# Patient Record
Sex: Female | Born: 1970 | Race: White | Hispanic: No | Marital: Married | Smoking: Never smoker
Health system: Southern US, Community
[De-identification: ages and names within clinical notes are randomized; demographics above are authoritative.]

## PROBLEM LIST (undated history)

## (undated) DIAGNOSIS — E785 Hyperlipidemia, unspecified: Secondary | ICD-10-CM

## (undated) DIAGNOSIS — I341 Nonrheumatic mitral (valve) prolapse: Secondary | ICD-10-CM

## (undated) DIAGNOSIS — G4700 Insomnia, unspecified: Secondary | ICD-10-CM

## (undated) DIAGNOSIS — I10 Essential (primary) hypertension: Secondary | ICD-10-CM

## (undated) HISTORY — DX: Nonrheumatic mitral (valve) prolapse: I34.1

## (undated) HISTORY — PX: BREAST ENHANCEMENT SURGERY: SHX7

## (undated) HISTORY — PX: WISDOM TOOTH EXTRACTION: SHX21

## (undated) HISTORY — DX: Insomnia, unspecified: G47.00

## (undated) HISTORY — DX: Hyperlipidemia, unspecified: E78.5

## (undated) HISTORY — PX: AUGMENTATION MAMMAPLASTY: SUR837

---

## 1997-12-22 ENCOUNTER — Inpatient Hospital Stay (HOSPITAL_COMMUNITY): Admission: AD | Admit: 1997-12-22 | Discharge: 1997-12-22 | Payer: Self-pay | Admitting: Obstetrics and Gynecology

## 1998-01-03 ENCOUNTER — Inpatient Hospital Stay (HOSPITAL_COMMUNITY): Admission: AD | Admit: 1998-01-03 | Discharge: 1998-01-05 | Payer: Self-pay | Admitting: *Deleted

## 1998-01-06 ENCOUNTER — Encounter: Admission: RE | Admit: 1998-01-06 | Discharge: 1998-04-06 | Payer: Self-pay | Admitting: *Deleted

## 2000-04-19 ENCOUNTER — Other Ambulatory Visit: Admission: RE | Admit: 2000-04-19 | Discharge: 2000-04-19 | Payer: Self-pay | Admitting: Obstetrics and Gynecology

## 2001-04-23 ENCOUNTER — Other Ambulatory Visit: Admission: RE | Admit: 2001-04-23 | Discharge: 2001-04-23 | Payer: Self-pay | Admitting: Obstetrics and Gynecology

## 2002-06-02 ENCOUNTER — Other Ambulatory Visit: Admission: RE | Admit: 2002-06-02 | Discharge: 2002-06-02 | Payer: Self-pay | Admitting: Obstetrics and Gynecology

## 2002-06-15 ENCOUNTER — Encounter: Payer: Self-pay | Admitting: Family Medicine

## 2002-06-15 ENCOUNTER — Ambulatory Visit (HOSPITAL_COMMUNITY): Admission: RE | Admit: 2002-06-15 | Discharge: 2002-06-15 | Payer: Self-pay | Admitting: Family Medicine

## 2003-06-04 ENCOUNTER — Other Ambulatory Visit: Admission: RE | Admit: 2003-06-04 | Discharge: 2003-06-04 | Payer: Self-pay | Admitting: Obstetrics and Gynecology

## 2003-11-27 ENCOUNTER — Encounter (INDEPENDENT_AMBULATORY_CARE_PROVIDER_SITE_OTHER): Payer: Self-pay | Admitting: *Deleted

## 2003-11-27 ENCOUNTER — Ambulatory Visit (HOSPITAL_COMMUNITY): Admission: RE | Admit: 2003-11-27 | Discharge: 2003-11-27 | Payer: Self-pay | Admitting: Obstetrics and Gynecology

## 2009-09-24 ENCOUNTER — Emergency Department (HOSPITAL_COMMUNITY): Admission: EM | Admit: 2009-09-24 | Discharge: 2009-09-24 | Payer: Self-pay | Admitting: Emergency Medicine

## 2010-09-11 ENCOUNTER — Ambulatory Visit (HOSPITAL_COMMUNITY): Admission: RE | Admit: 2010-09-11 | Discharge: 2010-09-11 | Payer: Self-pay | Admitting: Obstetrics and Gynecology

## 2011-03-23 NOTE — Op Note (Signed)
NAME:  Misty Hayes, Misty Hayes                          ACCOUNT NO.:  [id number]   MEDICAL RECORD NO.:  [id number]                   PATIENT TYPE:  AMB   LOCATION:  SDC                                  FACILITY:  [facility]   PHYSICIAN:  Maxie Better, M.D.            DATE OF BIRTH:  1971-01-17   DATE OF PROCEDURE:  11/27/2003  DATE OF DISCHARGE:                                 OPERATIVE REPORT   PREOPERATIVE DIAGNOSIS:  Blighted ovum.   PROCEDURE:  Suction dilation and evacuation.   POSTOPERATIVE DIAGNOSIS:  Blighted ovum.   ANESTHESIA:  MAC, paracervical block.   SURGEON:  Maxie Better, M.D.   INDICATIONS:  This is a 40 year old gravida 3, para 1-1-0-2, female with a  diagnosis of blighted ovum, who presents for surgical management.  The  patient initially tried conservative measures; however, she still has tissue  within the cavity.  Risks and benefits of the procedure have been explained  to the patient, consent was signed, and the patient was transferred to the  operating room.   DESCRIPTION OF PROCEDURE:  Under adequate monitored anesthesia, the patient  was placed in the dorsal lithotomy position.  She was sterilely prepped and  draped in the usual fashion and the bladder was catheterized of a small  amount of urine.  Bimanual examination revealed an anteverted uterus, about  eight weeks' size.  No adnexal masses could be appreciated.  Bivalve  speculum was placed in the vagina, 20 mL of 1% Nesacaine was injected  paracervically at 3 and 9 o'clock.  The cervix was noted to be parous and  friable.  The anterior lip of the cervix was grasped with a tenaculum.  The  cervix was serially dilated up to a #27 Pratt dilator.  A #8 mm curved  suction cannula was introduced into the uterine cavity, and each time a gush  of blood came through the cannula.  The cannula was attached to the suction  tube and a moderate amount of products of conception was removed.  The  cavity was then  curetted, suctioned, and when all tissue was felt to have  been removed, all instruments were then removed from the vagina.  A specimen  labeled products of conception was sent to pathology.  Estimated blood  loss was minimal.  Complications:  None.  The patient tolerated the  procedure well, was transferred to the recovery room in stable condition.                                               Maxie Better, M.D.    [place]/MEDQ  D:  11/27/2003  T:  11/27/2003  Job:  [id number]

## 2011-03-23 NOTE — H&P (Signed)
NAME:  Misty Hayes, Misty Hayes                          ACCOUNT NO.:  [id number]   MEDICAL RECORD NO.:  [id number]                   PATIENT TYPE:  AMB   LOCATION:  SDC                                  FACILITY:  [facility]   PHYSICIAN:  Maxie Better, M.D.            DATE OF BIRTH:  [birth year]   DATE OF ADMISSION:  11/27/2003  DATE OF DISCHARGE:                                HISTORY & PHYSICAL   CHIEF COMPLAINT:  Blighted ovum.   HISTORY OF PRESENT ILLNESS:  This is a 40 year old gravida 3, para 1-1-0-2  married white female, who is diagnosed with a missed abortion and is now  being admitted for surgical management.  The patient's last menstrual period  was September 15, 2003.  She underwent an ultrasound for first trimester  bleeding on November 12, 2003, at which time she was found to have  intrauterine pregnancy with no heartbeat.  The patient tried expected  management; however, has continued to have intermittent vaginal bleeding.  Redo of ultrasound on November 23, 2003 after increased vaginal bleeding  still showed the pregnancy within the uterus.  The patient is scheduled for  surgery.  She has had mild cramping.   PAST MEDICAL HISTORY:  1. Mitral valve prolapse with mitral regurgitation and aortic regurgitation.     Antibiotic prophylaxis.  2. Gestational diabetes.   ALLERGIES:  NO KNOWN DRUG ALLERGIES.   MEDICATIONS:  Prenatal vitamins.   PAST SURGICAL HISTORY:  1. Breast implantation.  2. LEEP procedure.  3. Left knee surgery.   OBSTETRICAL HISTORY:  Vaginal deliveries March 1999 and 1998 at 34 weeks.   FAMILY HISTORY:  Noncontributory.   SOCIAL HISTORY:  Married.  Two children.  Nonsmoker.   REVIEW OF SYSTEMS:  As per history of present illness, otherwise negative.   PHYSICAL EXAMINATION:  GENERAL:  Well developed, well nourished white  female, in no acute distress.  VITAL SIGNS:  Blood pressure 188/80, temperature 98.2, weight 161 pounds.  SKIN:  Shows no lesions.  HEENT:  Anicteric sclera.  Pink conjunctiva.  Oropharynx negative.  HEART:  Regular rate and rhythm.  LUNGS:  Clear to auscultation.  BREASTS:  Show no palpable mass.  Bilateral implants.  ABDOMEN:  Soft and nontender.  PELVIC:  Vulva shows no lesions.  Vagina had dark brown blood in the vault.  Cervix was closed.  Parous uterus about five-week size; anteverted and  nontender.  Adnexa nontender, no palpable mass.   IMPRESSION:  Blighted ovum.   PLAN:  Suction dilation evacuation.  Antibiotics prophylaxis for the mitral  valve prolapse history, using ampicillin and gentamicin.  The patient is  known to be blood type A-positive, therefore RhoGAM is not indicated.  The  risks of the procedure have been reviewed with the patient, including but  not limited to:  infection, bleeding, uterine perforation and its  management, retained products of conception and need for re-operation and  possible antibiotics  use at that time as well was discussed; internal scar  tissue, which may cause future fertility issues discussed; injury to  surrounding organs structures, such as the bladder, bowel, uterus _______  were discussed.  All questions were answered.                                               Maxie Better, M.D.    [place]/MED[facility]  D:  11/25/2003  T:  11/25/2003  Job:  [id number]

## 2011-09-19 ENCOUNTER — Other Ambulatory Visit (HOSPITAL_COMMUNITY): Payer: Self-pay | Admitting: Obstetrics and Gynecology

## 2011-09-19 DIAGNOSIS — Z1231 Encounter for screening mammogram for malignant neoplasm of breast: Secondary | ICD-10-CM

## 2011-10-18 ENCOUNTER — Ambulatory Visit (HOSPITAL_COMMUNITY)
Admission: RE | Admit: 2011-10-18 | Discharge: 2011-10-18 | Disposition: A | Discharge status: Home / Self Care | Payer: BC Managed Care – PPO | Source: Ambulatory Visit | Attending: Obstetrics and Gynecology | Admitting: Obstetrics and Gynecology

## 2011-10-18 DIAGNOSIS — Z1231 Encounter for screening mammogram for malignant neoplasm of breast: Secondary | ICD-10-CM | POA: Insufficient documentation

## 2012-09-11 ENCOUNTER — Other Ambulatory Visit (HOSPITAL_COMMUNITY): Payer: Self-pay | Admitting: Obstetrics and Gynecology

## 2012-09-11 DIAGNOSIS — Z1231 Encounter for screening mammogram for malignant neoplasm of breast: Secondary | ICD-10-CM

## 2012-10-20 ENCOUNTER — Ambulatory Visit (HOSPITAL_COMMUNITY)
Admission: RE | Admit: 2012-10-20 | Discharge: 2012-10-20 | Disposition: A | Discharge status: Home / Self Care | Payer: BC Managed Care – PPO | Source: Ambulatory Visit | Attending: Obstetrics and Gynecology | Admitting: Obstetrics and Gynecology

## 2012-10-20 ENCOUNTER — Other Ambulatory Visit (HOSPITAL_COMMUNITY): Payer: Self-pay | Admitting: Obstetrics and Gynecology

## 2012-10-20 DIAGNOSIS — Z1231 Encounter for screening mammogram for malignant neoplasm of breast: Secondary | ICD-10-CM | POA: Insufficient documentation

## 2013-01-30 ENCOUNTER — Ambulatory Visit (INDEPENDENT_AMBULATORY_CARE_PROVIDER_SITE_OTHER): Payer: BC Managed Care – PPO | Admitting: Family Medicine

## 2013-01-30 ENCOUNTER — Encounter: Payer: Self-pay | Admitting: Family Medicine

## 2013-01-30 VITALS — BP 122/78 | HR 80 | Ht 67.0 in | Wt 169.2 lb

## 2013-01-30 DIAGNOSIS — J309 Allergic rhinitis, unspecified: Secondary | ICD-10-CM | POA: Insufficient documentation

## 2013-01-30 DIAGNOSIS — G4700 Insomnia, unspecified: Secondary | ICD-10-CM

## 2013-01-30 DIAGNOSIS — I059 Rheumatic mitral valve disease, unspecified: Secondary | ICD-10-CM

## 2013-01-30 DIAGNOSIS — I341 Nonrheumatic mitral (valve) prolapse: Secondary | ICD-10-CM

## 2013-01-30 NOTE — Patient Instructions (Signed)
Try benadryl 25 mg at night ads needed.

## 2013-01-30 NOTE — Progress Notes (Signed)
  Subjective:    Patient ID: Misty Hayes, female    DOB: 10-Mar-1971, 42 y.o.   MRN: [id number]  Anxiety Presents for initial visit. Onset was 1 to 5 years ago. The problem has been gradually improving. Symptoms include decreased concentration. Patient reports no chest pain, compulsions or feeling of choking. Symptoms occur constantly. The severity of symptoms is mild (worse at work). The symptoms are aggravated by family issues. The quality of sleep is poor. Nighttime awakenings: early a.m. for rest of night (not the best sleep pattern worse. melatonin didn't help much).   There are no known risk factors. Her past medical history is significant for depression. Past treatments include nothing. The treatment provided mild relief. Compliance with prior treatments has been good.    occas jumping of eart. dx'ed long ago. Associated with mitral valve prolapse. Nasal congestion. Chronic. Zyrtec-D twice a day definitely helps.  Review of Systems  Cardiovascular: Negative for chest pain.  Psychiatric/Behavioral: Positive for decreased concentration.   Ros otherwise neg    Objective:   Physical Exam Alert. Vitals reviewed. HEENT slight nasal congestion. Heart regular rate and rhythm. No opening snap auscultated. Lungs clear to auscultation. Neuro exam intact.   Allergic rhinitis  Insomnia  No orders of the defined types were placed in this encounter.       Assessment & Plan:  Impression #1 chronic rhinitis discussed. #2 insomnia ongoing discussed. #3 anxiety clinically stable patient states benefiting from medicines discussed.plan as per orders. Medicine refilled. Diet exercise discussed. Recheck in 6 months. 25 minutes spent most in discussion. WSL

## 2013-02-01 ENCOUNTER — Encounter: Payer: Self-pay | Admitting: Family Medicine

## 2013-02-01 DIAGNOSIS — I341 Nonrheumatic mitral (valve) prolapse: Secondary | ICD-10-CM | POA: Insufficient documentation

## 2013-02-02 ENCOUNTER — Ambulatory Visit: Payer: Self-pay | Admitting: Family Medicine

## 2013-04-21 ENCOUNTER — Encounter: Payer: Self-pay | Admitting: Family Medicine

## 2013-04-21 ENCOUNTER — Ambulatory Visit (INDEPENDENT_AMBULATORY_CARE_PROVIDER_SITE_OTHER): Payer: BC Managed Care – PPO | Admitting: Family Medicine

## 2013-04-21 VITALS — BP 130/82 | Temp 98.3°F | Wt 168.2 lb

## 2013-04-21 DIAGNOSIS — R21 Rash and other nonspecific skin eruption: Secondary | ICD-10-CM

## 2013-04-21 NOTE — Progress Notes (Signed)
  Subjective:    Patient ID: Misty Hayes, female    DOB: [birth year], 42 y.o.   MRN: [id number]  HPI Noticed a spot on the leg. No itching or burning. Bullseye appearance. Itching at times. They thought it looked like a spider bite. Got more inflammed. Just applied something for itching  Review of Systems    no fever no chills no rash elsewhere ROS otherwise negative Objective:   Physical Exam Alert no acute distress. Lungs clear. Heart regular rate and rhythm. HEENT normal. Skin small patch of faint erythema around tiny bite site. No tenderness.       Assessment & Plan:  Impression local bite with secondary skin reaction discussed. Plan symptomatic care only and reassurance.

## 2013-07-16 ENCOUNTER — Other Ambulatory Visit: Payer: Self-pay | Admitting: Family Medicine

## 2013-09-17 ENCOUNTER — Other Ambulatory Visit (HOSPITAL_COMMUNITY): Payer: Self-pay | Admitting: Obstetrics and Gynecology

## 2013-09-17 DIAGNOSIS — Z1231 Encounter for screening mammogram for malignant neoplasm of breast: Secondary | ICD-10-CM

## 2013-09-25 ENCOUNTER — Other Ambulatory Visit: Payer: Self-pay | Admitting: Family Medicine

## 2013-10-22 ENCOUNTER — Ambulatory Visit (HOSPITAL_COMMUNITY): Payer: BC Managed Care – PPO

## 2013-10-22 ENCOUNTER — Ambulatory Visit (HOSPITAL_COMMUNITY)
Admission: RE | Admit: 2013-10-22 | Discharge: 2013-10-22 | Disposition: A | Discharge status: Home / Self Care | Payer: BC Managed Care – PPO | Source: Ambulatory Visit | Attending: Obstetrics and Gynecology | Admitting: Obstetrics and Gynecology

## 2013-10-22 DIAGNOSIS — Z1231 Encounter for screening mammogram for malignant neoplasm of breast: Secondary | ICD-10-CM | POA: Insufficient documentation

## 2013-12-09 ENCOUNTER — Ambulatory Visit (INDEPENDENT_AMBULATORY_CARE_PROVIDER_SITE_OTHER): Payer: BC Managed Care – PPO | Admitting: Family Medicine

## 2013-12-09 ENCOUNTER — Encounter: Payer: Self-pay | Admitting: Family Medicine

## 2013-12-09 VITALS — BP 122/78 | Temp 98.6°F | Ht 67.0 in | Wt 169.0 lb

## 2013-12-09 DIAGNOSIS — J310 Chronic rhinitis: Secondary | ICD-10-CM

## 2013-12-09 DIAGNOSIS — J329 Chronic sinusitis, unspecified: Secondary | ICD-10-CM

## 2013-12-09 MED ORDER — CITALOPRAM HYDROBROMIDE 20 MG PO TABS: ORAL_TABLET | ORAL | Status: DC

## 2013-12-09 MED ORDER — AMOXICILLIN 500 MG PO CAPS: 500.0000 mg | ORAL_CAPSULE | Freq: Three times a day (TID) | ORAL | Status: DC

## 2013-12-09 NOTE — Progress Notes (Signed)
   Subjective:    Patient ID: Misty RooseveltMisty M Hayes, female    DOB: 03/11/1971, 43 y.o.   MRN: [id number]  Sore Throat  This is a new problem. The current episode started in the past 7 days. The pain is worse on the right side. Associated symptoms include ear pain. She has tried NSAIDs for the symptoms. The treatment provided mild relief.   Monday pain in fthe throat, more and more sore  Then right ear pain  Headache off and on  Sinus cong  Non smoker  used three ibu prn pain    Review of Systems  HENT: Positive for ear pain.    no vomiting no diarrhea no chest pain no back pain ROS otherwise negative     Objective:   Physical Exam  Alert no apparent distress. HEENT moderate nasal congestion frontal tenderness pharynx erythematous neck supple. Lungs clear. Heart regular in rhythm.      Assessment & Plan:  Impression 1 rhinosinusitis plan antibiotics prescribed. Symptomatic care discussed. WSL

## 2014-06-29 ENCOUNTER — Encounter: Payer: Self-pay | Admitting: Family Medicine

## 2014-06-29 ENCOUNTER — Ambulatory Visit (INDEPENDENT_AMBULATORY_CARE_PROVIDER_SITE_OTHER): Payer: BC Managed Care – PPO | Admitting: Family Medicine

## 2014-06-29 VITALS — BP 130/80 | Ht 67.0 in | Wt 174.0 lb

## 2014-06-29 DIAGNOSIS — I341 Nonrheumatic mitral (valve) prolapse: Secondary | ICD-10-CM

## 2014-06-29 DIAGNOSIS — I059 Rheumatic mitral valve disease, unspecified: Secondary | ICD-10-CM

## 2014-06-29 DIAGNOSIS — G4700 Insomnia, unspecified: Secondary | ICD-10-CM

## 2014-06-29 MED ORDER — CITALOPRAM HYDROBROMIDE 20 MG PO TABS: ORAL_TABLET | ORAL | Status: DC

## 2014-06-29 NOTE — Progress Notes (Signed)
   Subjective:    Patient ID: Misty Hayes, female    DOB: [birth year], 43 y.o.   MRN: [id number]  HPI Patient is here today for a 6 month med check.  Walking regularly fift min per wk   Sticking with nmed   Pt works in cubicles, has diff time with loud noise  Irritated first thing in thre morn, with stress at work   Patient still has difficulty sleeping though overall much improved. Notes that the Celexa definitely helps her.  Review of Systems No headache no chest pain no back pain no change in bowel habits no blood in stool ROS otherwise negative    Objective:   Physical Exam Alert no acute distress. Vitals stable. Lungs clear. Heart regular in rhythm. H&T normal. Feet without edema.       Assessment & Plan:  Impression 1 insomnia ongoing. With elements of anxiety and depression clinically stable #2 hyperlipidemia no recent blood work due soon patient states form bring by air for [place] to look at. Plan maintain same meds. Diet exercise discussed. WSL

## 2014-12-06 ENCOUNTER — Other Ambulatory Visit (HOSPITAL_COMMUNITY): Payer: Self-pay | Admitting: Obstetrics and Gynecology

## 2014-12-06 DIAGNOSIS — Z1231 Encounter for screening mammogram for malignant neoplasm of breast: Secondary | ICD-10-CM

## 2014-12-10 ENCOUNTER — Ambulatory Visit (HOSPITAL_COMMUNITY)
Admission: RE | Admit: 2014-12-10 | Discharge: 2014-12-10 | Disposition: A | Discharge status: Home / Self Care | Payer: BLUE CROSS/BLUE SHIELD | Source: Ambulatory Visit | Attending: Obstetrics and Gynecology | Admitting: Obstetrics and Gynecology

## 2014-12-10 ENCOUNTER — Other Ambulatory Visit (HOSPITAL_COMMUNITY): Payer: Self-pay | Admitting: Obstetrics and Gynecology

## 2014-12-10 DIAGNOSIS — Z1231 Encounter for screening mammogram for malignant neoplasm of breast: Secondary | ICD-10-CM | POA: Diagnosis present

## 2015-02-03 ENCOUNTER — Other Ambulatory Visit: Payer: Self-pay | Admitting: Family Medicine

## 2015-03-25 ENCOUNTER — Encounter: Payer: Self-pay | Admitting: Family Medicine

## 2015-03-25 ENCOUNTER — Ambulatory Visit (INDEPENDENT_AMBULATORY_CARE_PROVIDER_SITE_OTHER): Payer: BLUE CROSS/BLUE SHIELD | Admitting: Family Medicine

## 2015-03-25 VITALS — BP 130/80 | Ht 67.0 in | Wt 184.1 lb

## 2015-03-25 DIAGNOSIS — G4700 Insomnia, unspecified: Secondary | ICD-10-CM | POA: Diagnosis not present

## 2015-03-25 MED ORDER — CITALOPRAM HYDROBROMIDE 20 MG PO TABS: ORAL_TABLET | ORAL | Status: DC

## 2015-03-25 NOTE — Progress Notes (Signed)
   Subjective:    Patient ID: Misty Hayes, female    DOB: 06/08/1971, 44 y.o.   MRN: [id number]  HPI Patient is here today for a follow up visit on insomnia  . Patient states that she has no concerns at this time. Patient is doing very well.   Pt now walking again, has changed eating habit  Has lost a little weight, eating better,   Ongoing substantial stress with a 45 year old son who is misbehaving.  Reports to generic Celexa still definitely helping.  Review of Systems No headache no cough no chest pain no weight loss no weight gain    Objective:   Physical Exam Alert vitals stable slight malaise. HEENT normal lungs clear heart regular rate and rhythm       Assessment & Plan:  Impression element of depression with insomnia clinically stable plan maintain same medication. Strongly encouraged to work on exercise and diet. Follow-up every 6 months. WSL

## 2015-06-06 ENCOUNTER — Encounter: Payer: Self-pay | Admitting: Family Medicine

## 2015-06-06 ENCOUNTER — Ambulatory Visit (INDEPENDENT_AMBULATORY_CARE_PROVIDER_SITE_OTHER): Payer: BLUE CROSS/BLUE SHIELD | Admitting: Family Medicine

## 2015-06-06 VITALS — BP 122/88 | Ht 67.0 in | Wt 178.0 lb

## 2015-06-06 DIAGNOSIS — G4700 Insomnia, unspecified: Secondary | ICD-10-CM | POA: Diagnosis not present

## 2015-06-06 DIAGNOSIS — E785 Hyperlipidemia, unspecified: Secondary | ICD-10-CM

## 2015-06-06 MED ORDER — CITALOPRAM HYDROBROMIDE 20 MG PO TABS: ORAL_TABLET | ORAL | Status: DC

## 2015-06-06 NOTE — Progress Notes (Signed)
   Subjective:    Patient ID: Misty Hayes, female    DOB: 26-Jul-1971, 44 y.o.   MRN: [id number]  HPIpt arrives today for lab results. Pt states no concerns today.   No results found for this or any previous visit.  She takes celexa.   fam hx of high chol, no heart disease, none in siblings   Not exrcising any more or less, usually five d per wk  Diet overall prety good  mon thru [place] , othe than that water Not having any problems.     Review of Systems No headache no chest pain no back pain no abdominal pain no change in bowel habits    Objective:   Physical Exam Alert vitals stable no acute distress. Lungs clear. Heart regular in rhythm. H&T normal.       Assessment & Plan:  Impression 1 hyperlipidemia discussed not high enough yet for medications #2 depression good control plan diet exercise discussed. No prescription meds for cholesterol. Diet exercise discussed encouraged follow-up in 6 months scan blood work [facility]

## 2015-06-06 NOTE — Patient Instructions (Signed)

## 2015-06-07 DIAGNOSIS — E785 Hyperlipidemia, unspecified: Secondary | ICD-10-CM | POA: Insufficient documentation

## 2015-12-07 ENCOUNTER — Ambulatory Visit: Payer: BLUE CROSS/BLUE SHIELD | Admitting: Family Medicine

## 2016-01-04 ENCOUNTER — Other Ambulatory Visit: Payer: Self-pay | Admitting: Obstetrics and Gynecology

## 2016-01-04 DIAGNOSIS — R928 Other abnormal and inconclusive findings on diagnostic imaging of breast: Secondary | ICD-10-CM

## 2016-01-17 ENCOUNTER — Ambulatory Visit
Admission: RE | Admit: 2016-01-17 | Discharge: 2016-01-17 | Disposition: A | Discharge status: Home / Self Care | Payer: BLUE CROSS/BLUE SHIELD | Source: Ambulatory Visit | Attending: Obstetrics and Gynecology | Admitting: Obstetrics and Gynecology

## 2016-01-17 DIAGNOSIS — R928 Other abnormal and inconclusive findings on diagnostic imaging of breast: Secondary | ICD-10-CM

## 2016-04-10 ENCOUNTER — Encounter: Payer: Self-pay | Admitting: Family Medicine

## 2016-04-10 ENCOUNTER — Ambulatory Visit (INDEPENDENT_AMBULATORY_CARE_PROVIDER_SITE_OTHER): Payer: BLUE CROSS/BLUE SHIELD | Admitting: Family Medicine

## 2016-04-10 VITALS — BP 128/74 | Ht 67.0 in | Wt 164.0 lb

## 2016-04-10 DIAGNOSIS — F411 Generalized anxiety disorder: Secondary | ICD-10-CM | POA: Diagnosis not present

## 2016-04-10 DIAGNOSIS — E785 Hyperlipidemia, unspecified: Secondary | ICD-10-CM | POA: Diagnosis not present

## 2016-04-10 DIAGNOSIS — G4700 Insomnia, unspecified: Secondary | ICD-10-CM | POA: Diagnosis not present

## 2016-04-10 MED ORDER — CITALOPRAM HYDROBROMIDE 20 MG PO TABS: ORAL_TABLET | ORAL | Status: DC

## 2016-04-10 NOTE — Progress Notes (Signed)
   Subjective:    Patient ID: Misty Hayes, female    DOB: 10/22/1971, 45 y.o.   MRN: [id number]  Hyperlipidemia This is a chronic problem. The current episode started more than 1 year ago. Current antihyperlipidemic treatment includes diet change. Compliance problems include adherence to exercise.    Pt states no concerns today.   Works at [employer] gets free b  w  Lots of thirty pounds  Melatonin helos sleep bdtter and soundr   celexa benerfits definitely  , No obvious side effects with definitely like to stand medication. Still lacking execise    Review of Systems No headache, no major weight loss or weight gain, no chest pain no back pain abdominal pain no change in bowel habits complete ROS otherwise negativ    Objective:   Physical Exam  Alert no acute distress blood pressure good on repeat HEENT malngs clear heartRegular in rhythm      Assessment & Plan:  ImpressionChronic anxiety clinically stable on meds tolerating well no obvious side effects to maintain #2 hyperlipidemia status uncertain plan patient to send blood work our way working hard on diet and exercise Celexa refilled recheck in 6 months WSL

## 2016-06-15 ENCOUNTER — Other Ambulatory Visit: Payer: Self-pay | Admitting: Obstetrics and Gynecology

## 2016-06-15 DIAGNOSIS — N63 Unspecified lump in unspecified breast: Secondary | ICD-10-CM

## 2016-07-13 ENCOUNTER — Ambulatory Visit
Admission: RE | Admit: 2016-07-13 | Discharge: 2016-07-13 | Disposition: A | Discharge status: Home / Self Care | Payer: BLUE CROSS/BLUE SHIELD | Source: Ambulatory Visit | Attending: Obstetrics and Gynecology | Admitting: Obstetrics and Gynecology

## 2016-07-13 ENCOUNTER — Other Ambulatory Visit: Payer: Self-pay | Admitting: Obstetrics and Gynecology

## 2016-07-13 DIAGNOSIS — N63 Unspecified lump in unspecified breast: Secondary | ICD-10-CM

## 2016-07-13 DIAGNOSIS — R922 Inconclusive mammogram: Secondary | ICD-10-CM | POA: Diagnosis not present

## 2016-07-13 DIAGNOSIS — N6489 Other specified disorders of breast: Secondary | ICD-10-CM | POA: Diagnosis not present

## 2016-08-29 ENCOUNTER — Telehealth: Payer: Self-pay | Admitting: Family Medicine

## 2016-08-29 NOTE — Telephone Encounter (Signed)
Review blood work results in [facility] folder.

## 2016-09-03 ENCOUNTER — Encounter: Payer: Self-pay | Admitting: Family Medicine

## 2016-09-03 ENCOUNTER — Ambulatory Visit (INDEPENDENT_AMBULATORY_CARE_PROVIDER_SITE_OTHER): Payer: BLUE CROSS/BLUE SHIELD | Admitting: Family Medicine

## 2016-09-03 VITALS — BP 112/82 | Ht 67.0 in | Wt 164.2 lb

## 2016-09-03 DIAGNOSIS — F5101 Primary insomnia: Secondary | ICD-10-CM | POA: Diagnosis not present

## 2016-09-03 DIAGNOSIS — E785 Hyperlipidemia, unspecified: Secondary | ICD-10-CM

## 2016-09-03 DIAGNOSIS — F411 Generalized anxiety disorder: Secondary | ICD-10-CM

## 2016-09-03 MED ORDER — CITALOPRAM HYDROBROMIDE 20 MG PO TABS: ORAL_TABLET | ORAL | 1 refills | Status: DC

## 2016-09-03 NOTE — Progress Notes (Signed)
   Subjective:    Patient ID: Misty Hayes, female    DOB: 05/16/1971, 45 y.o.   MRN: [id number]  Hyperlipidemia  This is a chronic problem. The current episode started more than 1 year ago. There are no known factors aggravating her hyperlipidemia. Current antihyperlipidemic treatment includes diet change. The current treatment provides moderate improvement of lipids. There are no compliance problems.  There are no known risk factors for coronary artery disease.   Patient has no other concerns at this time.  Self grades diet as good  Eats a lot of grilled foods,    Exercise poor,  No exercise around house equipment  Not doing ti these days  128 76    Not a flu shot person  celexa  ompliaant and helpoing   BBnT    Review of Systems No headache, no major weight loss or weight gain, no chest pain no back pain abdominal pain no change in bowel habits complete ROS otherwise negative     Objective:   Physical Exam  Alert vitals stable, NAD. Blood pressure good on repeat. HEENT normal. Lungs clear. Heart regular rate and rhythm.       Assessment & Plan:  Impression 1 hyperlipidemia numbers reviewed today. Not high enough medications. Patient admits room for improvement on both diet and exercise #2 chronic anxiety with element of insomnia good control Celexa patient maintain. Exercise diet discussed in encourage recheck in 6 months

## 2016-09-11 ENCOUNTER — Encounter: Payer: Self-pay | Admitting: Family Medicine

## 2016-09-24 DIAGNOSIS — Z01419 Encounter for gynecological examination (general) (routine) without abnormal findings: Secondary | ICD-10-CM | POA: Diagnosis not present

## 2016-09-24 DIAGNOSIS — Z1151 Encounter for screening for human papillomavirus (HPV): Secondary | ICD-10-CM | POA: Diagnosis not present

## 2016-09-24 DIAGNOSIS — Z6825 Body mass index (BMI) 25.0-25.9, adult: Secondary | ICD-10-CM | POA: Diagnosis not present

## 2016-10-11 ENCOUNTER — Ambulatory Visit: Payer: BLUE CROSS/BLUE SHIELD | Admitting: Family Medicine

## 2016-11-26 ENCOUNTER — Ambulatory Visit (INDEPENDENT_AMBULATORY_CARE_PROVIDER_SITE_OTHER): Payer: BLUE CROSS/BLUE SHIELD | Admitting: Family Medicine

## 2016-11-26 ENCOUNTER — Encounter: Payer: Self-pay | Admitting: Family Medicine

## 2016-11-26 VITALS — Temp 97.9°F | Ht 67.0 in | Wt 169.4 lb

## 2016-11-26 DIAGNOSIS — B349 Viral infection, unspecified: Secondary | ICD-10-CM

## 2016-11-26 MED ORDER — OSELTAMIVIR PHOSPHATE 75 MG PO CAPS: 75.0000 mg | ORAL_CAPSULE | Freq: Two times a day (BID) | ORAL | 0 refills | Status: AC

## 2016-11-26 MED ORDER — ONDANSETRON 4 MG PO TBDP: 4.0000 mg | ORAL_TABLET | Freq: Three times a day (TID) | ORAL | 1 refills | Status: DC | PRN

## 2016-11-26 NOTE — Progress Notes (Signed)
   Subjective:    Patient ID: Misty RooseveltMisty M Pena, female    DOB: 08/09/1971, 46 y.o.   MRN: [id number]  HPI Patient is here today for nausea, chills, sore muscles and left ear pain. Onset started today. Treatments tried: ibuprofen with no relief.   Three pclock this morn, Rather sudden onset  Bad vomiting   Muscles and joints ching helped soe  Headache severe. Diffuse in nature. All day long. Non-throbbing  Four epsiode s    No diarrhea  Mild head pain  Review of Systems     Objective:   Physical Exam  Alert moderate malaise. Hydration good. Vitals stable lungs clear heart regular in rhythm       Assessment & Plan:  Impression 1 viral syndrome potentially flu with gastrointestinal component discussed plan Tamiflu twice a day 5 days. Symptom care discussed Zofran when necessary

## 2016-12-07 ENCOUNTER — Other Ambulatory Visit: Payer: Self-pay | Admitting: Obstetrics and Gynecology

## 2016-12-07 DIAGNOSIS — N63 Unspecified lump in unspecified breast: Secondary | ICD-10-CM

## 2016-12-28 ENCOUNTER — Ambulatory Visit
Admission: RE | Admit: 2016-12-28 | Discharge: 2016-12-28 | Disposition: A | Discharge status: Home / Self Care | Payer: BLUE CROSS/BLUE SHIELD | Source: Ambulatory Visit | Attending: Obstetrics and Gynecology | Admitting: Obstetrics and Gynecology

## 2016-12-28 DIAGNOSIS — N63 Unspecified lump in unspecified breast: Secondary | ICD-10-CM

## 2016-12-28 DIAGNOSIS — N6489 Other specified disorders of breast: Secondary | ICD-10-CM | POA: Diagnosis not present

## 2016-12-28 DIAGNOSIS — R922 Inconclusive mammogram: Secondary | ICD-10-CM | POA: Diagnosis not present

## 2017-03-04 ENCOUNTER — Encounter: Payer: Self-pay | Admitting: Family Medicine

## 2017-03-04 ENCOUNTER — Ambulatory Visit (INDEPENDENT_AMBULATORY_CARE_PROVIDER_SITE_OTHER): Payer: BLUE CROSS/BLUE SHIELD | Admitting: Family Medicine

## 2017-03-04 VITALS — BP 122/76 | Ht 67.0 in | Wt 178.0 lb

## 2017-03-04 DIAGNOSIS — F5101 Primary insomnia: Secondary | ICD-10-CM | POA: Diagnosis not present

## 2017-03-04 DIAGNOSIS — F411 Generalized anxiety disorder: Secondary | ICD-10-CM

## 2017-03-04 MED ORDER — CITALOPRAM HYDROBROMIDE 20 MG PO TABS: ORAL_TABLET | ORAL | 1 refills | Status: DC

## 2017-03-04 NOTE — Progress Notes (Signed)
   Subjective:    Patient ID: Misty Hayes, female    DOB: 06/01/1971, 46 y.o.   MRN: [id number]  HPI Patient arrives for a follow up on depression and insomnia. No problems or concerns.  Wide open   BB n T   Walking is picking up  Four out of seven Days will exercise meds going well stil knowck the edge off    Patient notes ongoing compliance with antidepressant medication. No obvious side effects. Reports does not miss a dose. Overall continues to help depression substantially. No thoughts of homicide or suicide. Would like to maintain medication.  Patient compliant with insomnia medication. Generally takes most nights. No obvious morning drowsiness. Definitely helps patient sleep. Without it patient states would not get a good nights rest.   Review of Systems No headache, no major weight loss or weight gain, no chest pain no back pain abdominal pain no change in bowel habits complete ROS otherwise negative     Objective:   Physical Exam  Alert vitals stable, NAD. Blood pressure good on repeat. HEENT normal. Lungs clear. Heart regular rate and rhythm.       Assessment & Plan:  Impression chronic anxiety with element of depression clinically stable on meds to continue. Also to maintain medicine for insomnia. Diet exercise discuss follow-up in 6 months

## 2017-05-22 ENCOUNTER — Other Ambulatory Visit: Payer: Self-pay | Admitting: Obstetrics and Gynecology

## 2017-05-22 DIAGNOSIS — N6489 Other specified disorders of breast: Secondary | ICD-10-CM

## 2017-07-03 ENCOUNTER — Ambulatory Visit: Admission: RE | Admit: 2017-07-03 | Payer: BLUE CROSS/BLUE SHIELD | Source: Ambulatory Visit

## 2017-07-03 ENCOUNTER — Ambulatory Visit
Admission: RE | Admit: 2017-07-03 | Discharge: 2017-07-03 | Disposition: A | Discharge status: Home / Self Care | Payer: BLUE CROSS/BLUE SHIELD | Source: Ambulatory Visit | Attending: Obstetrics and Gynecology | Admitting: Obstetrics and Gynecology

## 2017-07-03 DIAGNOSIS — R922 Inconclusive mammogram: Secondary | ICD-10-CM | POA: Diagnosis not present

## 2017-07-03 DIAGNOSIS — N6489 Other specified disorders of breast: Secondary | ICD-10-CM

## 2017-09-04 ENCOUNTER — Telehealth: Payer: Self-pay | Admitting: Family Medicine

## 2017-09-04 NOTE — Telephone Encounter (Signed)
Review lab results in results folder. °

## 2017-09-16 ENCOUNTER — Encounter: Payer: Self-pay | Admitting: Family Medicine

## 2017-09-16 ENCOUNTER — Ambulatory Visit: Payer: BLUE CROSS/BLUE SHIELD | Admitting: Family Medicine

## 2017-09-16 VITALS — BP 122/76 | Ht 67.0 in | Wt 195.6 lb

## 2017-09-16 DIAGNOSIS — F411 Generalized anxiety disorder: Secondary | ICD-10-CM | POA: Diagnosis not present

## 2017-09-16 DIAGNOSIS — F5101 Primary insomnia: Secondary | ICD-10-CM | POA: Diagnosis not present

## 2017-09-16 MED ORDER — CITALOPRAM HYDROBROMIDE 40 MG PO TABS: 40.0000 mg | ORAL_TABLET | Freq: Every day | ORAL | 1 refills | Status: DC

## 2017-09-16 MED ORDER — CITALOPRAM HYDROBROMIDE 40 MG PO TABS: 40.0000 mg | ORAL_TABLET | Freq: Every day | ORAL | 5 refills | Status: DC

## 2017-09-16 NOTE — Progress Notes (Signed)
   Subjective:    Patient ID: Misty RooseveltMisty M Mandala, female    DOB: 08/03/1971, 46 y.o.   MRN: [id number]  HPI  Patient arrives for a follow up on depression and anxiety. Patient states she is having more problems with anxiety lately.  troble sleeping, worse in the crtain times of yr  No suicidal thoughts, incr anxiety lately, incr stress at work   increas usually tis time of yr   Review of Systems No headache, no major weight loss or weight gain, no chest pain no back pain abdominal pain no change in bowel habits complete ROS otherwise negative     Objective:   Physical Exam   ImpressionAlert vitals stable, NAD. Blood pressure good on repeat. HEENT normal. Lungs clear. Heart regular rate and rhythm.      Assessment & Plan:  Impression generalized anxiety disorder.  Discussed.  Symptoms somewhat worse.  We will increase Celexa to 40.  Rationale discussed.  Exercise encouraged.  Follow-up in 6 months

## 2017-09-21 ENCOUNTER — Other Ambulatory Visit: Payer: Self-pay | Admitting: Family Medicine

## 2017-09-23 NOTE — Telephone Encounter (Signed)
Last seen 09/16/17

## 2017-10-01 DIAGNOSIS — Z1151 Encounter for screening for human papillomavirus (HPV): Secondary | ICD-10-CM | POA: Diagnosis not present

## 2017-10-01 DIAGNOSIS — Z6829 Body mass index (BMI) 29.0-29.9, adult: Secondary | ICD-10-CM | POA: Diagnosis not present

## 2017-10-01 DIAGNOSIS — Z01419 Encounter for gynecological examination (general) (routine) without abnormal findings: Secondary | ICD-10-CM | POA: Diagnosis not present

## 2017-10-04 ENCOUNTER — Encounter: Payer: Self-pay | Admitting: Family Medicine

## 2017-10-04 ENCOUNTER — Telehealth: Payer: Self-pay

## 2017-10-04 ENCOUNTER — Ambulatory Visit: Payer: BLUE CROSS/BLUE SHIELD | Admitting: Family Medicine

## 2017-10-04 VITALS — BP 134/88 | Temp 98.4°F | Ht 67.0 in | Wt 194.0 lb

## 2017-10-04 DIAGNOSIS — J329 Chronic sinusitis, unspecified: Secondary | ICD-10-CM | POA: Diagnosis not present

## 2017-10-04 DIAGNOSIS — J310 Chronic rhinitis: Secondary | ICD-10-CM

## 2017-10-04 MED ORDER — CEFDINIR 300 MG PO CAPS: 300.0000 mg | ORAL_CAPSULE | Freq: Two times a day (BID) | ORAL | 0 refills | Status: DC

## 2017-10-04 MED ORDER — BENZONATATE 100 MG PO CAPS: ORAL_CAPSULE | ORAL | 0 refills | Status: DC

## 2017-10-04 NOTE — Progress Notes (Signed)
   Subjective:    Patient ID: Misty RooseveltMisty M Stahle, female    DOB: 01/19/1971, 46 y.o.   MRN: [id number]  HPI Patient is here today for sinus drainage and a cough with some wheezing that she has had for around ten days. She is taking otc dayquil and nyquil she is sore under her right breast from all of the coughing. Some oough productivy  No know n exposure   No wheezing hx  No smoke exposure   Pos day and night   No notieabe fever         Review of Systems No headache, no major weight loss or weight gain, no chest pain no back pain abdominal pain no change in bowel habits complete ROS otherwise negative     Objective:   Physical Exam  Alert, mild malaise. Hydration good Vitals stable. frontal/ maxillary tenderness evident positive nasal congestion. pharynx normal neck supple  lungs clear/no crackles or wheezes. heart regular in rhythm       Assessment & Plan:  Impression rhinosinusitis likely post viral, discussed with patient. plan antibiotics prescribed. Questions answered. Symptomatic care discussed. warning signs discussed. WSL

## 2017-10-04 NOTE — Telephone Encounter (Signed)
Omnicef was sent electronically to [facility] by Dr.Steve Luking. Pt needed this sent to local pharmacy. We sent rx to the local pharmacy and called [facility] and spoke with Lennox Grumblesudy the pharmacist whom is aware to cancel the rx there.

## 2017-10-11 ENCOUNTER — Other Ambulatory Visit: Payer: Self-pay | Admitting: *Deleted

## 2017-10-11 MED ORDER — CITALOPRAM HYDROBROMIDE 40 MG PO TABS: 40.0000 mg | ORAL_TABLET | Freq: Every day | ORAL | 1 refills | Status: DC

## 2017-11-26 ENCOUNTER — Other Ambulatory Visit: Payer: Self-pay | Admitting: Obstetrics and Gynecology

## 2017-11-26 DIAGNOSIS — Z1231 Encounter for screening mammogram for malignant neoplasm of breast: Secondary | ICD-10-CM

## 2017-12-14 ENCOUNTER — Other Ambulatory Visit: Payer: Self-pay | Admitting: Family Medicine

## 2017-12-18 DIAGNOSIS — Z713 Dietary counseling and surveillance: Secondary | ICD-10-CM | POA: Diagnosis not present

## 2017-12-24 ENCOUNTER — Other Ambulatory Visit: Payer: Self-pay

## 2017-12-30 ENCOUNTER — Ambulatory Visit
Admission: RE | Admit: 2017-12-30 | Discharge: 2017-12-30 | Disposition: A | Discharge status: Home / Self Care | Payer: BLUE CROSS/BLUE SHIELD | Source: Ambulatory Visit | Attending: Obstetrics and Gynecology | Admitting: Obstetrics and Gynecology

## 2017-12-30 DIAGNOSIS — Z1231 Encounter for screening mammogram for malignant neoplasm of breast: Secondary | ICD-10-CM | POA: Diagnosis not present

## 2018-02-12 DIAGNOSIS — Z713 Dietary counseling and surveillance: Secondary | ICD-10-CM | POA: Diagnosis not present

## 2018-03-17 ENCOUNTER — Encounter: Payer: Self-pay | Admitting: Family Medicine

## 2018-03-17 ENCOUNTER — Ambulatory Visit: Payer: BLUE CROSS/BLUE SHIELD | Admitting: Family Medicine

## 2018-03-17 VITALS — BP 132/88 | Ht 67.0 in | Wt 189.0 lb

## 2018-03-17 DIAGNOSIS — F5101 Primary insomnia: Secondary | ICD-10-CM

## 2018-03-17 DIAGNOSIS — F411 Generalized anxiety disorder: Secondary | ICD-10-CM

## 2018-03-17 MED ORDER — CITALOPRAM HYDROBROMIDE 40 MG PO TABS: 40.0000 mg | ORAL_TABLET | Freq: Every day | ORAL | 1 refills | Status: DC

## 2018-03-17 NOTE — Progress Notes (Signed)
   Subjective:    Patient ID: Misty Hayes, female    DOB: December 15, 1970, 47 y.o.   MRN: [id number]   Anxiety   Presents for follow-up visit.    taking celexa. No issues. Needs refills.  Handling well , sig control    Sleeping t night time s  eexercise starting soon with the gym     Review of Systems No headache, no major weight loss or weight gain, no chest pain no back pain abdominal pain no change in bowel habits complete ROS otherwise negative     Objective:   Physical Exam   Alert vitals stable, NAD. Blood pressure good on repeat. HEENT normal. Lungs clear. Heart regular rate and rhythm.      Assessment & Plan:  1Impression generalized anxiety disorder.  Discussed.  Very stable on medication.  Tolerating medicines well.  I recommended we go ahead and since this is chronic and tolerating so well give her 1 years worth and see yearly rationale discussed

## 2018-03-22 MED ORDER — CITALOPRAM HYDROBROMIDE 40 MG PO TABS: 40.0000 mg | ORAL_TABLET | Freq: Every day | ORAL | 3 refills | Status: DC

## 2018-04-18 DIAGNOSIS — Z713 Dietary counseling and surveillance: Secondary | ICD-10-CM | POA: Diagnosis not present

## 2018-06-10 ENCOUNTER — Encounter: Payer: Self-pay | Admitting: Family Medicine

## 2018-06-10 ENCOUNTER — Ambulatory Visit: Payer: BLUE CROSS/BLUE SHIELD | Admitting: Family Medicine

## 2018-06-10 DIAGNOSIS — I10 Essential (primary) hypertension: Secondary | ICD-10-CM | POA: Diagnosis not present

## 2018-06-10 NOTE — Patient Instructions (Signed)
[employer] 5  lifesource 70  Ck a couple bp's [er week and record   Hypertension Hypertension, commonly called high blood pressure, is when the force of blood pumping through the arteries is too strong. The arteries are the blood vessels that carry blood from the heart throughout the body. Hypertension forces the heart to work harder to pump blood and may cause arteries to become narrow or stiff. Having untreated or uncontrolled hypertension can cause heart attacks, strokes, kidney disease, and other problems. A blood pressure reading consists of a higher number over a lower number. Ideally, your blood pressure should be below 120/80. The first ("top") number is called the systolic pressure. It is a measure of the pressure in your arteries as your heart beats. The second ("bottom") number is called the diastolic pressure. It is a measure of the pressure in your arteries as the heart relaxes. What are the causes? The cause of this condition is not known. What increases the risk? Some risk factors for high blood pressure are under your control. Others are not. Factors you can change  Smoking.  Having type 2 diabetes mellitus, high cholesterol, or both.  Not getting enough exercise or physical activity.  Being overweight.  Having too much fat, sugar, calories, or salt (sodium) in your diet.  Drinking too much alcohol. Factors that are difficult or impossible to change  Having chronic kidney disease.  Having a family history of high blood pressure.  Age. Risk increases with age.  Race. You may be at higher risk if you are African-American.  Gender. Men are at higher risk than women before age 48. After age 965, women are at higher risk than men.  Having obstructive sleep apnea.  Stress. What are the signs or symptoms? Extremely high blood pressure (hypertensive crisis) may cause:  Headache.  Anxiety.  Shortness of breath.  Nosebleed.  Nausea and vomiting.  Severe chest  pain.  Jerky movements you cannot control (seizures).  How is this diagnosed? This condition is diagnosed by measuring your blood pressure while you are seated, with your arm resting on a surface. The cuff of the blood pressure monitor will be placed directly against the skin of your upper arm at the level of your heart. It should be measured at least twice using the same arm. Certain conditions can cause a difference in blood pressure between your right and left arms. Certain factors can cause blood pressure readings to be lower or higher than normal (elevated) for a short period of time:  When your blood pressure is higher when you are in a health care provider's office than when you are at home, this is called white coat hypertension. Most people with this condition do not need medicines.  When your blood pressure is higher at home than when you are in a health care provider's office, this is called masked hypertension. Most people with this condition may need medicines to control blood pressure.  If you have a high blood pressure reading during one visit or you have normal blood pressure with other risk factors:  You may be asked to return on a different day to have your blood pressure checked again.  You may be asked to monitor your blood pressure at home for 1 week or longer.  If you are diagnosed with hypertension, you may have other blood or imaging tests to help your health care provider understand your overall risk for other conditions. How is this treated? This condition is treated by making  healthy lifestyle changes, such as eating healthy foods, exercising more, and reducing your alcohol intake. Your health care provider may prescribe medicine if lifestyle changes are not enough to get your blood pressure under control, and if:  Your systolic blood pressure is above 130.  Your diastolic blood pressure is above 80.  Your personal target blood pressure may vary depending on your  medical conditions, your age, and other factors. Follow these instructions at home: Eating and drinking  Eat a diet that is high in fiber and potassium, and low in sodium, added sugar, and fat. An example eating plan is called the DASH (Dietary Approaches to Stop Hypertension) diet. To eat this way: ? Eat plenty of fresh fruits and vegetables. Try to fill half of your plate at each meal with fruits and vegetables. ? Eat whole grains, such as whole wheat pasta, brown rice, or whole grain bread. Fill about one quarter of your plate with whole grains. ? Eat or drink low-fat dairy products, such as skim milk or low-fat yogurt. ? Avoid fatty cuts of meat, processed or cured meats, and poultry with skin. Fill about one quarter of your plate with lean proteins, such as fish, chicken without skin, beans, eggs, and tofu. ? Avoid premade and processed foods. These tend to be higher in sodium, added sugar, and fat.  Reduce your daily sodium intake. Most people with hypertension should eat less than 1,500 mg of sodium a day.  Limit alcohol intake to no more than 1 drink a day for nonpregnant women and 2 drinks a day for men. One drink equals 12 oz of beer, 5 oz of wine, or 1 oz of hard liquor. Lifestyle  Work with your health care provider to maintain a healthy body weight or to lose weight. Ask what an ideal weight is for you.  Get at least 30 minutes of exercise that causes your heart to beat faster (aerobic exercise) most days of the week. Activities may include walking, swimming, or biking.  Include exercise to strengthen your muscles (resistance exercise), such as pilates or lifting weights, as part of your weekly exercise routine. Try to do these types of exercises for 30 minutes at least 3 days a week.  Do not use any products that contain nicotine or tobacco, such as cigarettes and e-cigarettes. If you need help quitting, ask your health care provider.  Monitor your blood pressure at home as  told by your health care provider.  Keep all follow-up visits as told by your health care provider. This is important. Medicines  Take over-the-counter and prescription medicines only as told by your health care provider. Follow directions carefully. Blood pressure medicines must be taken as prescribed.  Do not skip doses of blood pressure medicine. Doing this puts you at risk for problems and can make the medicine less effective.  Ask your health care provider about side effects or reactions to medicines that you should watch for. Contact a health care provider if:  You think you are having a reaction to a medicine you are taking.  You have headaches that keep coming back (recurring).  You feel dizzy.  You have swelling in your ankles.  You have trouble with your vision. Get help right away if:  You develop a severe headache or confusion.  You have unusual weakness or numbness.  You feel faint.  You have severe pain in your chest or abdomen.  You vomit repeatedly.  You have trouble breathing. Summary  Hypertension is when  the force of blood pumping through your arteries is too strong. If this condition is not controlled, it may put you at risk for serious complications.  Your personal target blood pressure may vary depending on your medical conditions, your age, and other factors. For most people, a normal blood pressure is less than 120/80.  Hypertension is treated with lifestyle changes, medicines, or a combination of both. Lifestyle changes include weight loss, eating a healthy, low-sodium diet, exercising more, and limiting alcohol. This information is not intended to replace advice given to you by your health care provider. Make sure you discuss any questions you have with your health care provider. Document Released: 10/22/2005 Document Revised: 09/19/2016 Document Reviewed: 09/19/2016 Elsevier Interactive Patient Education  [employer].

## 2018-06-10 NOTE — Progress Notes (Signed)
   Subjective:    Patient ID: Misty RooseveltMisty M Hayes, female    DOB: 06/15/1971, 47 y.o.   MRN: [id number]  HPI  Patient is here today due to her bp being elevated for a while now.SHe states she always has headaches nothing new.     botj parents have high blood presure   On occs numbers have been up lately  Last wk bp pretty much evey day  syst in the 140s and dias low to upper 80s        Has long hs X of h a's , works at [employer]    occas wa;king with exercise nce or twice per wk, half hr to hr  Does not add salt, unless potatoes   Review of Systems No headache, no major weight loss or weight gain, no chest pain no back pain abdominal pain no change in bowel habits complete ROS otherwise negative     Objective:   Physical Exam Alert vitals stable, NAD. Blood pressure still mildly elevated.  Repeat. HEENT normal. Lungs clear. Heart regular rate and rhythm.        Assessment & Plan:  Impression essential hypertension mild elevation.  Too early to initiate medication.  Rationale discussed at length.  Educational information given.  Follow-up as scheduled.  Patient may wish to soft blood pressures different cuff choices discussed

## 2018-07-02 DIAGNOSIS — Z713 Dietary counseling and surveillance: Secondary | ICD-10-CM | POA: Diagnosis not present

## 2018-10-07 DIAGNOSIS — Z113 Encounter for screening for infections with a predominantly sexual mode of transmission: Secondary | ICD-10-CM | POA: Diagnosis not present

## 2018-10-07 DIAGNOSIS — Z6830 Body mass index (BMI) 30.0-30.9, adult: Secondary | ICD-10-CM | POA: Diagnosis not present

## 2018-10-07 DIAGNOSIS — Z01419 Encounter for gynecological examination (general) (routine) without abnormal findings: Secondary | ICD-10-CM | POA: Diagnosis not present

## 2018-10-07 DIAGNOSIS — Z124 Encounter for screening for malignant neoplasm of cervix: Secondary | ICD-10-CM | POA: Diagnosis not present

## 2018-10-07 DIAGNOSIS — Z1151 Encounter for screening for human papillomavirus (HPV): Secondary | ICD-10-CM | POA: Diagnosis not present

## 2018-12-11 ENCOUNTER — Ambulatory Visit: Payer: BLUE CROSS/BLUE SHIELD | Admitting: Family Medicine

## 2018-12-18 DIAGNOSIS — K1370 Unspecified lesions of oral mucosa: Secondary | ICD-10-CM | POA: Diagnosis not present

## 2018-12-19 ENCOUNTER — Other Ambulatory Visit: Payer: Self-pay | Admitting: Obstetrics and Gynecology

## 2018-12-19 DIAGNOSIS — Z1231 Encounter for screening mammogram for malignant neoplasm of breast: Secondary | ICD-10-CM

## 2019-01-19 ENCOUNTER — Other Ambulatory Visit: Payer: Self-pay

## 2019-01-19 ENCOUNTER — Ambulatory Visit
Admission: RE | Admit: 2019-01-19 | Discharge: 2019-01-19 | Disposition: A | Discharge status: Home / Self Care | Payer: BLUE CROSS/BLUE SHIELD | Source: Ambulatory Visit | Attending: Obstetrics and Gynecology | Admitting: Obstetrics and Gynecology

## 2019-01-19 DIAGNOSIS — Z1231 Encounter for screening mammogram for malignant neoplasm of breast: Secondary | ICD-10-CM

## 2019-02-22 ENCOUNTER — Other Ambulatory Visit: Payer: Self-pay | Admitting: Family Medicine

## 2019-03-31 ENCOUNTER — Encounter: Payer: Self-pay | Admitting: Family Medicine

## 2019-03-31 ENCOUNTER — Ambulatory Visit (INDEPENDENT_AMBULATORY_CARE_PROVIDER_SITE_OTHER): Payer: BLUE CROSS/BLUE SHIELD | Admitting: Family Medicine

## 2019-03-31 ENCOUNTER — Other Ambulatory Visit: Payer: Self-pay

## 2019-03-31 DIAGNOSIS — F411 Generalized anxiety disorder: Secondary | ICD-10-CM | POA: Diagnosis not present

## 2019-03-31 DIAGNOSIS — I10 Essential (primary) hypertension: Secondary | ICD-10-CM

## 2019-03-31 DIAGNOSIS — F5101 Primary insomnia: Secondary | ICD-10-CM | POA: Diagnosis not present

## 2019-03-31 MED ORDER — CITALOPRAM HYDROBROMIDE 40 MG PO TABS: 40.0000 mg | ORAL_TABLET | Freq: Every day | ORAL | 1 refills | Status: DC

## 2019-03-31 NOTE — Progress Notes (Signed)
   Subjective:  Audio only  Patient ID: Misty Hayes, female    DOB: 20-Dec-1970, 48 y.o.   MRN: [id number] Audio only HPI depression. Takes celexa 40mg  one daily. Pt states med is working well and no concerns today.   Virtual Visit via Telephone Note  I connected with [name] on 03/31/19 at  9:30 AM EDT by telephone and verified that I am speaking with the correct person using two identifiers.  Location: Patient: home  Provider: office   I discussed the limitations, risks, security and privacy concerns of performing an evaluation and management service by telephone and the availability of in person appointments. I also discussed with the patient that there may be a patient responsible charge related to this service. The patient expressed understanding and agreed to proceed.   History of Present Illness:    Observations/Objective:   Assessment and Plan:   Follow Up Instructions:    I discussed the assessment and treatment plan with the patient. The patient was provided an opportunity to ask questions and all were answered. The patient agreed with the plan and demonstrated an understanding of the instructions.   The patient was advised to call back or seek an in-person evaluation if the symptoms worsen or if the condition fails to improve as anticipated.  I provided 15 minutes of non-face-to-face time during this encounter.  Patient states overall anxiety is generally stable.  Compliant with medications.  Some flareup understandably with the pandemic going on with stresses having no work at home.  Not exercising  Not watching diet well  Has not checked blood pressure recently    Review of Systems No headache, no major weight loss or weight gain, no chest pain no back pain abdominal pain no change in bowel habits complete ROS otherwise negative     Objective:   Physical Exam  Virtual visit      Assessment & Plan:  Impression 1 generalized anxiety  disorder clinically stable discussed maintain same meds  2.  Hypertension.  Salt intake reviewed exercise encouraged  3.  Hyperlipidemia evident on last blood work diet discussed and encouraged  Follow-up in 6 months medications refilled

## 2019-09-29 ENCOUNTER — Other Ambulatory Visit: Payer: Self-pay

## 2019-09-29 ENCOUNTER — Ambulatory Visit (INDEPENDENT_AMBULATORY_CARE_PROVIDER_SITE_OTHER): Payer: BC Managed Care – PPO | Admitting: Family Medicine

## 2019-09-29 ENCOUNTER — Encounter: Payer: Self-pay | Admitting: Family Medicine

## 2019-09-29 DIAGNOSIS — F411 Generalized anxiety disorder: Secondary | ICD-10-CM

## 2019-09-29 DIAGNOSIS — I10 Essential (primary) hypertension: Secondary | ICD-10-CM

## 2019-09-29 MED ORDER — CITALOPRAM HYDROBROMIDE 40 MG PO TABS: 40.0000 mg | ORAL_TABLET | Freq: Every day | ORAL | 1 refills | Status: DC

## 2019-09-29 NOTE — Progress Notes (Signed)
   Subjective:    Patient ID: Misty Hayes, female    DOB: Jul 18, 1971, 48 y.o.   MRN: [id number]  HPIMed check up on citalopram. Pt states she is doing well on med and no concerns today.  phq 9 done.   Virtual Visit via Telephone Note  I connected with Misty Hayes on 09/29/19 at  8:30 AM EST by telephone and verified that I am speaking with the correct person using two identifiers.  Location: Patient: home Provider: office   I discussed the limitations, risks, security and privacy concerns of performing an evaluation and management service by telephone and the availability of in person appointments. I also discussed with the patient that there may be a patient responsible charge related to this service. The patient expressed understanding and agreed to proceed.   History of Present Illness:    Observations/Objective:   Assessment and Plan:   Follow Up Instructions:    I discussed the assessment and treatment plan with the patient. The patient was provided an opportunity to ask questions and all were answered. The patient agreed with the plan and demonstrated an understanding of the instructions.   The patient was advised to call back or seek an in-person evaluation if the symptoms worsen or if the condition fails to improve as anticipated.  I provided 18 minutes of non-face-to-face time during this encounter.   Anxiety has improved considerably.  Handling the Celexa well.  Helped her symptoms considerably.  Definitely wishes to stay on it.  Hx of hi blood pressure. Not exercising currently  b p good recently when she had it checked.   Review of Systems No headache, no major weight loss or weight gain, no chest pain no back pain abdominal pain no change in bowel habits complete ROS otherwise negative     Objective:   Physical Exam  Virtual      Assessment & Plan:  Impression history of hypertension apparently control overall good.  Patient not  exercising.  Exercise encouraged  2.  Generalized anxiety disorder.  Clinically stable discussed to maintain meds  Follow-up in 6 months diet exercise discussed

## 2019-10-12 ENCOUNTER — Telehealth: Payer: Self-pay | Admitting: Family Medicine

## 2019-10-12 MED ORDER — VALACYCLOVIR HCL 1 G PO TABS: ORAL_TABLET | ORAL | 5 refills | Status: DC

## 2019-10-12 NOTE — Telephone Encounter (Signed)
Pt would like to have medicine for fever blisters called into [place], [place] [facility] [place]. HARRISON S

## 2019-10-12 NOTE — Telephone Encounter (Signed)
Valtrex 1 g two at first sign of fever blister, two 12 hrs later 4 , 6 ref

## 2019-10-12 NOTE — Telephone Encounter (Signed)
Prescription sent electronically to pharmacy. Patient notified. 

## 2019-10-15 DIAGNOSIS — Z1151 Encounter for screening for human papillomavirus (HPV): Secondary | ICD-10-CM | POA: Diagnosis not present

## 2019-10-15 DIAGNOSIS — Z6830 Body mass index (BMI) 30.0-30.9, adult: Secondary | ICD-10-CM | POA: Diagnosis not present

## 2019-10-15 DIAGNOSIS — Z124 Encounter for screening for malignant neoplasm of cervix: Secondary | ICD-10-CM | POA: Diagnosis not present

## 2019-10-15 DIAGNOSIS — Z01419 Encounter for gynecological examination (general) (routine) without abnormal findings: Secondary | ICD-10-CM | POA: Diagnosis not present

## 2019-12-09 ENCOUNTER — Encounter: Payer: Self-pay | Admitting: Family Medicine

## 2019-12-21 ENCOUNTER — Other Ambulatory Visit: Payer: Self-pay | Admitting: Obstetrics and Gynecology

## 2019-12-21 DIAGNOSIS — Z1231 Encounter for screening mammogram for malignant neoplasm of breast: Secondary | ICD-10-CM

## 2020-01-27 ENCOUNTER — Ambulatory Visit
Admission: RE | Admit: 2020-01-27 | Discharge: 2020-01-27 | Disposition: A | Discharge status: Home / Self Care | Payer: BC Managed Care – PPO | Source: Ambulatory Visit | Attending: Obstetrics and Gynecology | Admitting: Obstetrics and Gynecology

## 2020-01-27 ENCOUNTER — Other Ambulatory Visit: Payer: Self-pay

## 2020-01-27 DIAGNOSIS — Z1231 Encounter for screening mammogram for malignant neoplasm of breast: Secondary | ICD-10-CM

## 2020-02-08 ENCOUNTER — Other Ambulatory Visit: Payer: Self-pay

## 2020-02-08 ENCOUNTER — Ambulatory Visit: Payer: BC Managed Care – PPO | Admitting: Family Medicine

## 2020-02-08 VITALS — BP 158/92 | Temp 98.5°F | Wt 200.6 lb

## 2020-02-08 DIAGNOSIS — I10 Essential (primary) hypertension: Secondary | ICD-10-CM | POA: Diagnosis not present

## 2020-02-08 DIAGNOSIS — F411 Generalized anxiety disorder: Secondary | ICD-10-CM | POA: Diagnosis not present

## 2020-02-08 MED ORDER — CITALOPRAM HYDROBROMIDE 40 MG PO TABS: 40.0000 mg | ORAL_TABLET | Freq: Every day | ORAL | 1 refills | Status: DC

## 2020-02-08 MED ORDER — AMLODIPINE BESYLATE 5 MG PO TABS: ORAL_TABLET | ORAL | 1 refills | Status: DC

## 2020-02-08 NOTE — Patient Instructions (Signed)

## 2020-02-08 NOTE — Progress Notes (Signed)
   Subjective:    Patient ID: Misty Hayes, female    DOB: 1971-09-15, 49 y.o.   MRN: [id number]  HPI Patient comes in today because she is concerned about her blood pressure. Patient states she has checked it at [facility] 2 weeks in a row and each time her bp was around 166/97. In office today bp is 158/92.  bp  Pt just stared cecking recently again  Not a lot of energy   Feeling fatigued and tired   bp 160 systolic when cked times two at [facility]  Working at home the past yr    needing to work full times  Plus   Both parent s     No other concerns.   Review of Systems No headache, no major weight loss or weight gain, no chest pain no back pain abdominal pain no change in bowel habits complete ROS otherwise negative     Objective:   Physical Exam Alert vitals stable, NAD. Blood pressure still elevated on repeat. HEENT normal. Lungs clear. Heart regular rate and rhythm.        Assessment & Plan:  Impression essential hypertension.  Unsatisfactory control.  Options discussed.  Diet and exercise discussed and encouraged.  Initiate Norvasc 5 mg nightly.  Follow-up in several months  2.  Stressful job situation.  Patient maintaining citalopram for her anxiety and she feels this is sufficient

## 2020-03-15 ENCOUNTER — Ambulatory Visit: Payer: BC Managed Care – PPO | Admitting: Family Medicine

## 2020-03-15 ENCOUNTER — Other Ambulatory Visit: Payer: Self-pay

## 2020-03-15 VITALS — BP 136/84 | Temp 97.8°F | Wt 199.8 lb

## 2020-03-15 DIAGNOSIS — I10 Essential (primary) hypertension: Secondary | ICD-10-CM

## 2020-03-15 DIAGNOSIS — L559 Sunburn, unspecified: Secondary | ICD-10-CM | POA: Diagnosis not present

## 2020-03-15 MED ORDER — BETAMETHASONE DIPROPIONATE 0.05 % EX CREA: TOPICAL_CREAM | Freq: Two times a day (BID) | CUTANEOUS | 0 refills | Status: DC

## 2020-03-15 NOTE — Progress Notes (Signed)
   Subjective:    Patient ID: Misty Hayes, female    DOB: 1971/04/25, 49 y.o.   MRN: [id number]  HPI Patient comes in today with concerns of possible sun poisoning that occurred Saturday. Patient severe sunburn on the upper neck causing swelling on the left side with redness inflammation and some preblistering also some on the arms and legs  History blood pressure too Review of Systems    Relates a lot of soreness tenderness from the sunburn Objective:   Physical Exam Patient with severe second-degree sunburn on the left side of the neck She does have some fullness in that area but it is not a sign of any type of tumor that the best I can tell I think it is just localized swelling and edema from the severe burn I told her when the sunburn goes down the swelling in that area should go down if it does not she needs to follow-up here  Also has sunburn on the arms and the legs Blood pressure was rechecked it was good     Assessment & Plan:  Sunburn Steroid cream twice daily over the course the next 4 to 5 days not for the face Avoid excessive sun Blood pressure on recheck  good Continue current measures Encourage patient to do the best they can regarding dietary measures and activity follow-up in the fall

## 2020-03-15 NOTE — Patient Instructions (Signed)
DASH Eating Plan DASH stands for "Dietary Approaches to Stop Hypertension." The DASH eating plan is a healthy eating plan that has been shown to reduce high blood pressure (hypertension). It may also reduce your risk for type 2 diabetes, heart disease, and stroke. The DASH eating plan may also help with weight loss. What are tips for following this plan?  General guidelines  Avoid eating more than 2,300 mg (milligrams) of salt (sodium) a day. If you have hypertension, you may need to reduce your sodium intake to 1,500 mg a day.  Limit alcohol intake to no more than 1 drink a day for nonpregnant women and 2 drinks a day for men. One drink equals 12 oz of beer, 5 oz of wine, or 1 oz of hard liquor.  Work with your health care provider to maintain a healthy body weight or to lose weight. Ask what an ideal weight is for you.  Get at least 30 minutes of exercise that causes your heart to beat faster (aerobic exercise) most days of the week. Activities may include walking, swimming, or biking.  Work with your health care provider or diet and nutrition specialist (dietitian) to adjust your eating plan to your individual calorie needs. Reading food labels   Check food labels for the amount of sodium per serving. Choose foods with less than 5 percent of the Daily Value of sodium. Generally, foods with less than 300 mg of sodium per serving fit into this eating plan.  To find whole grains, look for the word "whole" as the first word in the ingredient list. Shopping  Buy products labeled as "low-sodium" or "no salt added."  Buy fresh foods. Avoid canned foods and premade or frozen meals. Cooking  Avoid adding salt when cooking. Use salt-free seasonings or herbs instead of table salt or sea salt. Check with your health care provider or pharmacist before using salt substitutes.  Do not fry foods. Cook foods using healthy methods such as baking, boiling, grilling, and broiling instead.  Cook with  heart-healthy oils, such as olive, canola, soybean, or sunflower oil. Meal planning  Eat a balanced diet that includes: ? 5 or more servings of fruits and vegetables each day. At each meal, try to fill half of your plate with fruits and vegetables. ? Up to 6-8 servings of whole grains each day. ? Less than 6 oz of lean meat, poultry, or fish each day. A 3-oz serving of meat is about the same size as a deck of cards. One egg equals 1 oz. ? 2 servings of low-fat dairy each day. ? A serving of nuts, seeds, or beans 5 times each week. ? Heart-healthy fats. Healthy fats called Omega-3 fatty acids are found in foods such as flaxseeds and coldwater fish, like sardines, salmon, and mackerel.  Limit how much you eat of the following: ? Canned or prepackaged foods. ? Food that is high in trans fat, such as fried foods. ? Food that is high in saturated fat, such as fatty meat. ? Sweets, desserts, sugary drinks, and other foods with added sugar. ? Full-fat dairy products.  Do not salt foods before eating.  Try to eat at least 2 vegetarian meals each week.  Eat more home-cooked food and less restaurant, buffet, and fast food.  When eating at a restaurant, ask that your food be prepared with less salt or no salt, if possible. What foods are recommended? The items listed may not be a complete list. Talk with your dietitian about   what dietary choices are best for you. Grains Whole-grain or whole-wheat bread. Whole-grain or whole-wheat pasta. Brown rice. Oatmeal. Quinoa. Bulgur. Whole-grain and low-sodium cereals. Pita bread. Low-fat, low-sodium crackers. Whole-wheat flour tortillas. Vegetables Fresh or frozen vegetables (raw, steamed, roasted, or grilled). Low-sodium or reduced-sodium tomato and vegetable juice. Low-sodium or reduced-sodium tomato sauce and tomato paste. Low-sodium or reduced-sodium canned vegetables. Fruits All fresh, dried, or frozen fruit. Canned fruit in natural juice (without  added sugar). Meat and other protein foods Skinless chicken or turkey. Ground chicken or turkey. Pork with fat trimmed off. Fish and seafood. Egg whites. Dried beans, peas, or lentils. Unsalted nuts, nut butters, and seeds. Unsalted canned beans. Lean cuts of beef with fat trimmed off. Low-sodium, lean deli meat. Dairy Low-fat (1%) or fat-free (skim) milk. Fat-free, low-fat, or reduced-fat cheeses. Nonfat, low-sodium ricotta or cottage cheese. Low-fat or nonfat yogurt. Low-fat, low-sodium cheese. Fats and oils Soft margarine without trans fats. Vegetable oil. Low-fat, reduced-fat, or light mayonnaise and salad dressings (reduced-sodium). Canola, safflower, olive, soybean, and sunflower oils. Avocado. Seasoning and other foods Herbs. Spices. Seasoning mixes without salt. Unsalted popcorn and pretzels. Fat-free sweets. What foods are not recommended? The items listed may not be a complete list. Talk with your dietitian about what dietary choices are best for you. Grains Baked goods made with fat, such as croissants, muffins, or some breads. Dry pasta or rice meal packs. Vegetables Creamed or fried vegetables. Vegetables in a cheese sauce. Regular canned vegetables (not low-sodium or reduced-sodium). Regular canned tomato sauce and paste (not low-sodium or reduced-sodium). Regular tomato and vegetable juice (not low-sodium or reduced-sodium). Pickles. Olives. Fruits Canned fruit in a light or heavy syrup. Fried fruit. Fruit in cream or butter sauce. Meat and other protein foods Fatty cuts of meat. Ribs. Fried meat. Bacon. Sausage. Bologna and other processed lunch meats. Salami. Fatback. Hotdogs. Bratwurst. Salted nuts and seeds. Canned beans with added salt. Canned or smoked fish. Whole eggs or egg yolks. Chicken or turkey with skin. Dairy Whole or 2% milk, cream, and half-and-half. Whole or full-fat cream cheese. Whole-fat or sweetened yogurt. Full-fat cheese. Nondairy creamers. Whipped toppings.  Processed cheese and cheese spreads. Fats and oils Butter. Stick margarine. Lard. Shortening. Ghee. Bacon fat. Tropical oils, such as coconut, palm kernel, or palm oil. Seasoning and other foods Salted popcorn and pretzels. Onion salt, garlic salt, seasoned salt, table salt, and sea salt. Worcestershire sauce. Tartar sauce. Barbecue sauce. Teriyaki sauce. Soy sauce, including reduced-sodium. Steak sauce. Canned and packaged gravies. Fish sauce. Oyster sauce. Cocktail sauce. Horseradish that you find on the shelf. Ketchup. Mustard. Meat flavorings and tenderizers. Bouillon cubes. Hot sauce and Tabasco sauce. Premade or packaged marinades. Premade or packaged taco seasonings. Relishes. Regular salad dressings. Where to find more information:  National Heart, Lung, and Blood Institute: www.nhlbi.nih.gov  American Heart Association: www.heart.org Summary  The DASH eating plan is a healthy eating plan that has been shown to reduce high blood pressure (hypertension). It may also reduce your risk for type 2 diabetes, heart disease, and stroke.  With the DASH eating plan, you should limit salt (sodium) intake to 2,300 mg a day. If you have hypertension, you may need to reduce your sodium intake to 1,500 mg a day.  When on the DASH eating plan, aim to eat more fresh fruits and vegetables, whole grains, lean proteins, low-fat dairy, and heart-healthy fats.  Work with your health care provider or diet and nutrition specialist (dietitian) to adjust your eating plan to your   individual calorie needs. This information is not intended to replace advice given to you by your health care provider. Make sure you discuss any questions you have with your health care provider. Document Revised: 10/04/2017 Document Reviewed: 10/15/2016 Elsevier Patient Education  2020 [employer]  

## 2020-03-20 ENCOUNTER — Other Ambulatory Visit: Payer: Self-pay | Admitting: Family Medicine

## 2020-03-21 NOTE — Telephone Encounter (Signed)
02/08/20 med check up

## 2020-03-30 ENCOUNTER — Other Ambulatory Visit: Payer: Self-pay | Admitting: *Deleted

## 2020-03-30 ENCOUNTER — Telehealth: Payer: Self-pay | Admitting: *Deleted

## 2020-03-30 DIAGNOSIS — R5383 Other fatigue: Secondary | ICD-10-CM

## 2020-03-30 DIAGNOSIS — Z13220 Encounter for screening for lipoid disorders: Secondary | ICD-10-CM

## 2020-03-30 DIAGNOSIS — Z79899 Other long term (current) drug therapy: Secondary | ICD-10-CM

## 2020-03-30 DIAGNOSIS — I10 Essential (primary) hypertension: Secondary | ICD-10-CM

## 2020-03-30 NOTE — Telephone Encounter (Signed)
Patient has appt 05/10/20 for bp check  Patient has no labs in Epic since 2019

## 2020-03-30 NOTE — Telephone Encounter (Signed)
Blood work ordered in Epic. Patient notified. 

## 2020-03-30 NOTE — Telephone Encounter (Signed)
Pls order, cbc, cmp, lipid panel, tsh.  Obtain fasting about 1 wk prior to appt.  Thx,   Dr. Ladona Ridgel

## 2020-04-19 DIAGNOSIS — Z13220 Encounter for screening for lipoid disorders: Secondary | ICD-10-CM | POA: Diagnosis not present

## 2020-04-19 DIAGNOSIS — Z79899 Other long term (current) drug therapy: Secondary | ICD-10-CM | POA: Diagnosis not present

## 2020-04-19 DIAGNOSIS — I10 Essential (primary) hypertension: Secondary | ICD-10-CM | POA: Diagnosis not present

## 2020-04-19 DIAGNOSIS — R5383 Other fatigue: Secondary | ICD-10-CM | POA: Diagnosis not present

## 2020-04-20 LAB — CBC WITH DIFFERENTIAL/PLATELET
Basophils Absolute: 0.1 10*3/uL (ref 0.0–0.2)
Basos: 1 %
EOS (ABSOLUTE): 0.2 10*3/uL (ref 0.0–0.4)
Eos: 2 %
Hematocrit: 38.2 % (ref 34.0–46.6)
Hemoglobin: 12.1 g/dL (ref 11.1–15.9)
Immature Grans (Abs): 0 10*3/uL (ref 0.0–0.1)
Immature Granulocytes: 0 %
Lymphocytes Absolute: 1.5 10*3/uL (ref 0.7–3.1)
Lymphs: 17 %
MCH: 27.1 pg (ref 26.6–33.0)
MCHC: 31.7 g/dL (ref 31.5–35.7)
MCV: 86 fL (ref 79–97)
Monocytes Absolute: 0.7 10*3/uL (ref 0.1–0.9)
Monocytes: 8 %
Neutrophils Absolute: 6.3 10*3/uL (ref 1.4–7.0)
Neutrophils: 72 %
Platelets: 341 10*3/uL (ref 150–450)
RBC: 4.46 x10E6/uL (ref 3.77–5.28)
RDW: 14.1 % (ref 11.7–15.4)
WBC: 8.7 10*3/uL (ref 3.4–10.8)

## 2020-04-20 LAB — LIPID PANEL
Chol/HDL Ratio: 5.2 ratio — ABNORMAL HIGH (ref 0.0–4.4)
Cholesterol, Total: 233 mg/dL — ABNORMAL HIGH (ref 100–199)
HDL: 45 mg/dL (ref >39)
LDL Chol Calc (NIH): 148 mg/dL — ABNORMAL HIGH (ref 0–99)
Triglycerides: 222 mg/dL — ABNORMAL HIGH (ref 0–149)
VLDL Cholesterol Cal: 40 mg/dL (ref 5–40)

## 2020-04-20 LAB — COMPREHENSIVE METABOLIC PANEL
ALT: 20 IU/L (ref 0–32)
AST: 20 IU/L (ref 0–40)
Albumin/Globulin Ratio: 1.2 (ref 1.2–2.2)
Albumin: 3.8 g/dL (ref 3.8–4.8)
Alkaline Phosphatase: 91 IU/L (ref 48–121)
BUN/Creatinine Ratio: 12 (ref 9–23)
BUN: 9 mg/dL (ref 6–24)
Bilirubin Total: 0.3 mg/dL (ref 0.0–1.2)
CO2: 22 mmol/L (ref 20–29)
Calcium: 8.7 mg/dL (ref 8.7–10.2)
Chloride: 100 mmol/L (ref 96–106)
Creatinine, Ser: 0.78 mg/dL (ref 0.57–1.00)
GFR calc Af Amer: 103 mL/min/{1.73_m2} (ref >59)
GFR calc non Af Amer: 90 mL/min/{1.73_m2} (ref >59)
Globulin, Total: 3.1 g/dL (ref 1.5–4.5)
Glucose: 90 mg/dL (ref 65–99)
Potassium: 4 mmol/L (ref 3.5–5.2)
Sodium: 136 mmol/L (ref 134–144)
Total Protein: 6.9 g/dL (ref 6.0–8.5)

## 2020-04-20 LAB — TSH: TSH: 2.13 u[IU]/mL (ref 0.450–4.500)

## 2020-05-02 ENCOUNTER — Ambulatory Visit: Payer: BC Managed Care – PPO | Admitting: Family Medicine

## 2020-05-10 ENCOUNTER — Ambulatory Visit: Payer: BC Managed Care – PPO | Admitting: Family Medicine

## 2020-05-16 ENCOUNTER — Ambulatory Visit: Payer: BC Managed Care – PPO | Admitting: Family Medicine

## 2020-05-16 ENCOUNTER — Encounter: Payer: Self-pay | Admitting: Family Medicine

## 2020-05-16 ENCOUNTER — Other Ambulatory Visit: Payer: Self-pay

## 2020-05-16 VITALS — BP 130/88 | HR 80 | Temp 97.5°F | Ht 67.0 in | Wt 202.2 lb

## 2020-05-16 DIAGNOSIS — I10 Essential (primary) hypertension: Secondary | ICD-10-CM

## 2020-05-16 MED ORDER — AMLODIPINE BESYLATE 5 MG PO TABS: 5.0000 mg | ORAL_TABLET | Freq: Every day | ORAL | 1 refills | Status: DC

## 2020-05-16 NOTE — Progress Notes (Signed)
Patient ID: Misty Hayes, female    DOB: 10/16/1971, 49 y.o.   MRN: [id number]   Chief Complaint  Patient presents with  . Hypertension   Subjective:    HPI Pt here for 3 month follow up on HTN. Pt checking blood pressure at home about once per week. Pt taking Amlodipine 5 mg at night. No blurred vision, no chest pain, no other HTN symptoms.   Pt is going to bathroom 2-3 times a night. Pt is taking Amlodipine at night and that is making her go to bathroom. No dysuria, hematuria.  Not drinking more fluids before bed.  Medical History Misty Hayes has a past medical history of Hyperlipidemia, Insomnia, and Mitral valve prolapse.   Outpatient Encounter Medications as of 05/16/2020  Medication Sig  . amLODipine (NORVASC) 5 MG tablet Take 1 tablet (5 mg total) by mouth daily.  . betamethasone dipropionate 0.05 % cream Apply topically 2 (two) times daily.  . cetirizine-pseudoephedrine (ZYRTEC-D) 5-120 MG per tablet Take 1 tablet by mouth 2 (two) times daily.  . citalopram (CELEXA) 40 MG tablet TAKE 1 TABLET BY MOUTH DAILY  . GILDESS FE 1/20 1-20 MG-MCG tablet   . Multiple Vitamin (MULTIVITAMIN) tablet Take 1 tablet by mouth daily.  Marland Kitchen OVER THE COUNTER MEDICATION Calcium take 2 every day  . valACYclovir (VALTREX) 1000 MG tablet 2 tabs at first sign of fever blister and 2 tabs 12 hours later  . [DISCONTINUED] amLODipine (NORVASC) 5 MG tablet Take 1 tablet qhs   No facility-administered encounter medications on file as of 05/16/2020.     Review of Systems  Constitutional: Negative for chills and fever.  HENT: Negative for congestion, rhinorrhea and sore throat.   Respiratory: Negative for cough, shortness of breath and wheezing.   Cardiovascular: Negative for chest pain and leg swelling.  Gastrointestinal: Negative for abdominal pain, diarrhea, nausea and vomiting.  Genitourinary: Negative for dysuria and frequency.       +nocturia  Musculoskeletal: Negative for arthralgias and back  pain.  Skin: Negative for rash.  Neurological: Negative for dizziness, weakness and headaches.     Vitals BP 130/88   Pulse 80   Temp (!) 97.5 F (36.4 C)   Ht 5\' 7"  (1.702 m)   Wt 202 lb 3.2 oz (91.7 kg)   SpO2 97%   BMI 31.67 kg/m   Objective:   Physical Exam Vitals and nursing note reviewed.  Constitutional:      Appearance: Normal appearance.  HENT:     Head: Normocephalic and atraumatic.     Nose: Nose normal.     Mouth/Throat:     Mouth: Mucous membranes are moist.     Pharynx: Oropharynx is clear.  Eyes:     Extraocular Movements: Extraocular movements intact.     Conjunctiva/sclera: Conjunctivae normal.     Pupils: Pupils are equal, round, and reactive to light.  Cardiovascular:     Rate and Rhythm: Normal rate and regular rhythm.     Pulses: Normal pulses.     Heart sounds: Normal heart sounds.  Pulmonary:     Effort: Pulmonary effort is normal.     Breath sounds: Normal breath sounds. No wheezing, rhonchi or rales.  Musculoskeletal:        General: Normal range of motion.     Right lower leg: No edema.     Left lower leg: No edema.  Skin:    General: Skin is warm and dry.     Findings: No  lesion or rash.  Neurological:     General: No focal deficit present.     Mental Status: She is alert and oriented to person, place, and time.  Psychiatric:        Mood and Affect: Mood normal.        Behavior: Behavior normal.      Assessment and Plan   1. Essential hypertension  Htn- suboptimal.  Will cont to monitor and dec salt intake.  Cont current meds and move to taking norvasc in am to see if improving on night time urination.  occ using valtrex for oral herpes.  Not needing refill at this time.  F/u 92mo or prn.

## 2020-08-31 ENCOUNTER — Ambulatory Visit
Admission: EM | Admit: 2020-08-31 | Discharge: 2020-08-31 | Disposition: A | Discharge status: Home / Self Care | Payer: BC Managed Care – PPO | Attending: Emergency Medicine | Admitting: Emergency Medicine

## 2020-08-31 ENCOUNTER — Other Ambulatory Visit: Payer: Self-pay

## 2020-08-31 DIAGNOSIS — R0981 Nasal congestion: Secondary | ICD-10-CM

## 2020-08-31 DIAGNOSIS — R0789 Other chest pain: Secondary | ICD-10-CM | POA: Diagnosis not present

## 2020-08-31 DIAGNOSIS — Z1152 Encounter for screening for COVID-19: Secondary | ICD-10-CM

## 2020-08-31 MED ORDER — FLUTICASONE PROPIONATE 50 MCG/ACT NA SUSP
1.0000 | Freq: Every day | NASAL | 0 refills | Status: DC
Start: 2020-08-31 — End: 2020-11-16

## 2020-08-31 MED ORDER — DEXAMETHASONE 4 MG PO TABS: 4.0000 mg | ORAL_TABLET | Freq: Every day | ORAL | 0 refills | Status: AC

## 2020-08-31 NOTE — Discharge Instructions (Signed)
COVID testing ordered.  It will take between 2-7 days for test results.  Someone will contact you regarding abnormal results.    In the meantime: You should remain isolated in your home for 10 days from symptom onset AND greater than 24 hours after symptoms resolution (absence of fever without the use of fever-reducing medication and improvement in respiratory symptoms), whichever is longer Get plenty of rest and push fluids Continue Zyrtec for nasal congestion, runny nose, and/or sore throat Fonase for nasal congestion and runny nose Decadron was prescribed/take as directed Use medications daily for symptom relief Use OTC medications like ibuprofen or tylenol as needed fever or pain Call or go to the ED if you have any new or worsening symptoms such as fever, worsening cough, shortness of breath, chest tightness, chest pain, turning blue, changes in mental status, etc..Marland Kitchen

## 2020-08-31 NOTE — ED Provider Notes (Signed)
[facility]   [id number] 08/31/20 Arrival Time: 1653   CC: COVID symptoms  SUBJECTIVE: History from: patient and family.  Misty Hayes is a 49 y.o. female who presented to the urgent care with a complaint of nasal congestion, chest tightness, and scratchy throat for the past couple days.  Denies sick exposure to COVID, flu or strep.  Denies recent travel.  Has tried OTC medication without relief.  Denies aggravating factors.  Denies previous symptoms in the past.   Denies fever, chills, fatigue, sinus pain, rhinorrhea, sore throat, SOB, wheezing, chest pain, nausea, changes in bowel or bladder habits.    ROS: As per HPI.  All other pertinent ROS negative.      Past Medical History:  Diagnosis Date  . Hyperlipidemia   . Insomnia   . Mitral valve prolapse    Past Surgical History:  Procedure Laterality Date  . AUGMENTATION MAMMAPLASTY Bilateral   . BREAST ENHANCEMENT SURGERY    . WISDOM TOOTH EXTRACTION     No Known Allergies No current facility-administered medications on file prior to encounter.   Current Outpatient Medications on File Prior to Encounter  Medication Sig Dispense Refill  . amLODipine (NORVASC) 5 MG tablet Take 1 tablet (5 mg total) by mouth daily. 90 tablet 1  . betamethasone dipropionate 0.05 % cream Apply topically 2 (two) times daily. 30 g 0  . cetirizine-pseudoephedrine (ZYRTEC-D) 5-120 MG per tablet Take 1 tablet by mouth 2 (two) times daily.    . citalopram (CELEXA) 40 MG tablet TAKE 1 TABLET BY MOUTH DAILY 90 tablet 1  . GILDESS FE 1/20 1-20 MG-MCG tablet     . Multiple Vitamin (MULTIVITAMIN) tablet Take 1 tablet by mouth daily.    Marland Kitchen OVER THE COUNTER MEDICATION Calcium take 2 every day    . valACYclovir (VALTREX) 1000 MG tablet 2 tabs at first sign of fever blister and 2 tabs 12 hours later 4 tablet 5   Social History   Socioeconomic History  . Marital status: Married    Spouse name: Not on file  . Number of children: Not on file   . Years of education: Not on file  . Highest education level: Not on file  Occupational History  . Not on file  Tobacco Use  . Smoking status: Never Smoker  . Smokeless tobacco: Never Used  Substance and Sexual Activity  . Alcohol use: Not on file  . Drug use: Not on file  . Sexual activity: Not on file  Other Topics Concern  . Not on file  Social History Narrative  . Not on file   Social Determinants of Health   Financial Resource Strain:   . Difficulty of Paying Living Expenses: Not on file  Food Insecurity:   . Worried About Programme researcher, broadcasting/film/video in the Last Year: Not on file  . Ran Out of Food in the Last Year: Not on file  Transportation Needs:   . Lack of Transportation (Medical): Not on file  . Lack of Transportation (Non-Medical): Not on file  Physical Activity:   . Days of Exercise per Week: Not on file  . Minutes of Exercise per Session: Not on file  Stress:   . Feeling of Stress : Not on file  Social Connections:   . Frequency of Communication with Friends and Family: Not on file  . Frequency of Social Gatherings with Friends and Family: Not on file  . Attends Religious Services: Not on file  . Active Member  of Clubs or Organizations: Not on file  . Attends Banker Meetings: Not on file  . Marital Status: Not on file  Intimate Partner Violence:   . Fear of Current or Ex-Partner: Not on file  . Emotionally Abused: Not on file  . Physically Abused: Not on file  . Sexually Abused: Not on file   Family History  Problem Relation Age of Onset  . Breast cancer Mother 86    OBJECTIVE:  Vitals:   08/31/20 1711  BP: (!) 143/85  Pulse: 79  Resp: 18  Temp: 98.3 F (36.8 C)  SpO2: 97%     General appearance: alert; appears fatigued, but nontoxic; speaking in full sentences and tolerating own secretions HEENT: NCAT; Ears: EACs clear, TMs pearly gray; Eyes: PERRL.  EOM grossly intact. Sinuses: nontender; Nose: nares patent without rhinorrhea,  Throat: oropharynx clear, tonsils non erythematous or enlarged, uvula midline  Neck: supple without LAD Lungs: unlabored respirations, symmetrical air entry; cough: moderate; no respiratory distress; CTAB Heart: regular rate and rhythm.  Radial pulses 2+ symmetrical bilaterally Skin: warm and dry Psychological: alert and cooperative; normal mood and affect  LABS:  No results found for this or any previous visit (from the past 24 hour(s)).   ASSESSMENT & PLAN:  1. Chest tightness   2. Nasal congestion   3. Encounter for screening for COVID-19     Meds ordered this encounter  Medications  . dexamethasone (DECADRON) 4 MG tablet    Sig: Take 1 tablet (4 mg total) by mouth daily for 7 days.    Dispense:  7 tablet    Refill:  0  . fluticasone (FLONASE) 50 MCG/ACT nasal spray    Sig: Place 1 spray into both nostrils daily for 14 days.    Dispense:  16 g    Refill:  0    Discharge Instructions  COVID testing ordered.  It will take between 2-7 days for test results.  Someone will contact you regarding abnormal results.    In the meantime: You should remain isolated in your home for 10 days from symptom onset AND greater than 24 hours after symptoms resolution (absence of fever without the use of fever-reducing medication and improvement in respiratory symptoms), whichever is longer Get plenty of rest and push fluids Continue Zyrtec for nasal congestion, runny nose, and/or sore throat Fonase for nasal congestion and runny nose Decadron was prescribed/take as directed Use medications daily for symptom relief Use OTC medications like ibuprofen or tylenol as needed fever or pain Call or go to the ED if you have any new or worsening symptoms such as fever, worsening cough, shortness of breath, chest tightness, chest pain, turning blue, changes in mental status, etc...   Reviewed expectations re: course of current medical issues. Questions answered. Outlined signs and symptoms  indicating need for more acute intervention. Patient verbalized understanding. After Visit Summary given.         Durward Parcel, FNP 08/31/20 1724

## 2020-08-31 NOTE — ED Triage Notes (Signed)
Pt presents with nasal congestion and chest tightness that began a couple days ago, also has scratchy throat

## 2020-09-01 LAB — NOVEL CORONAVIRUS, NAA: SARS-CoV-2, NAA: NOT DETECTED

## 2020-09-01 LAB — SARS-COV-2, NAA 2 DAY TAT

## 2020-09-12 ENCOUNTER — Telehealth: Payer: Self-pay | Admitting: Family Medicine

## 2020-09-12 MED ORDER — CITALOPRAM HYDROBROMIDE 40 MG PO TABS: 40.0000 mg | ORAL_TABLET | Freq: Every day | ORAL | 0 refills | Status: DC

## 2020-09-12 NOTE — Telephone Encounter (Signed)
[facility] requesting refill on Citalopram 40 mg tablets. Take one tablet po daily. Pt laste seen 05/16/20 for HTN. Please advise. Thank you

## 2020-10-13 DIAGNOSIS — Z803 Family history of malignant neoplasm of breast: Secondary | ICD-10-CM | POA: Diagnosis not present

## 2020-10-13 DIAGNOSIS — Z113 Encounter for screening for infections with a predominantly sexual mode of transmission: Secondary | ICD-10-CM | POA: Diagnosis not present

## 2020-10-13 DIAGNOSIS — Z124 Encounter for screening for malignant neoplasm of cervix: Secondary | ICD-10-CM | POA: Diagnosis not present

## 2020-10-13 DIAGNOSIS — Z6831 Body mass index (BMI) 31.0-31.9, adult: Secondary | ICD-10-CM | POA: Diagnosis not present

## 2020-10-13 DIAGNOSIS — Z309 Encounter for contraceptive management, unspecified: Secondary | ICD-10-CM | POA: Diagnosis not present

## 2020-10-13 DIAGNOSIS — Z01419 Encounter for gynecological examination (general) (routine) without abnormal findings: Secondary | ICD-10-CM | POA: Diagnosis not present

## 2020-10-13 DIAGNOSIS — N946 Dysmenorrhea, unspecified: Secondary | ICD-10-CM | POA: Diagnosis not present

## 2020-10-31 DIAGNOSIS — I10 Essential (primary) hypertension: Secondary | ICD-10-CM | POA: Diagnosis not present

## 2020-10-31 DIAGNOSIS — N946 Dysmenorrhea, unspecified: Secondary | ICD-10-CM | POA: Diagnosis not present

## 2020-10-31 DIAGNOSIS — D251 Intramural leiomyoma of uterus: Secondary | ICD-10-CM | POA: Diagnosis not present

## 2020-11-16 ENCOUNTER — Other Ambulatory Visit: Payer: Self-pay

## 2020-11-16 ENCOUNTER — Ambulatory Visit: Payer: BC Managed Care – PPO | Admitting: Family Medicine

## 2020-11-16 ENCOUNTER — Encounter: Payer: Self-pay | Admitting: Family Medicine

## 2020-11-16 VITALS — BP 126/84 | HR 92 | Temp 97.2°F | Ht 67.0 in | Wt 201.2 lb

## 2020-11-16 DIAGNOSIS — I10 Essential (primary) hypertension: Secondary | ICD-10-CM

## 2020-11-16 DIAGNOSIS — F320 Major depressive disorder, single episode, mild: Secondary | ICD-10-CM

## 2020-11-16 MED ORDER — AMLODIPINE BESYLATE 5 MG PO TABS: 5.0000 mg | ORAL_TABLET | Freq: Every day | ORAL | 1 refills | Status: DC

## 2020-11-16 NOTE — Progress Notes (Signed)
Patient ID: Misty Hayes, female    DOB: April 25, 1971, 50 y.o.   MRN: [id number]   Chief Complaint  Patient presents with  . Hypertension    Nocturia follow up   Subjective:  CC: medication management for HTN and depression   Patient wanted to discuss best calcium supplements This is not a new problem.  Presents today for medication management follow-up for hypertension and depression.  She does not report any concerns about her health today, she is 50 years old, is due for colon cancer screening, will discuss.  Last laboratory work was done 04/19/2020, which reveals normal kidney and liver function.  She sees GYN, who is managing her hormone therapy.  She denies fever, chills, chest pain, shortness of breath.    Medical History Misty Hayes has a past medical history of Hyperlipidemia, Insomnia, and Mitral valve prolapse.   Outpatient Encounter Medications as of 11/16/2020  Medication Sig  . amLODipine (NORVASC) 5 MG tablet Take 1 tablet (5 mg total) by mouth daily.  . cetirizine-pseudoephedrine (ZYRTEC-D) 5-120 MG per tablet Take 1 tablet by mouth 2 (two) times daily.  . citalopram (CELEXA) 40 MG tablet Take 1 tablet (40 mg total) by mouth daily.  Marland Kitchen GILDESS FE 1/20 1-20 MG-MCG tablet   . Multiple Vitamin (MULTIVITAMIN) tablet Take 1 tablet by mouth daily.  Marland Kitchen OVER THE COUNTER MEDICATION Calcium take 2 every day  . [DISCONTINUED] betamethasone dipropionate 0.05 % cream Apply topically 2 (two) times daily.  . [DISCONTINUED] fluticasone (FLONASE) 50 MCG/ACT nasal spray Place 1 spray into both nostrils daily for 14 days.  . [DISCONTINUED] valACYclovir (VALTREX) 1000 MG tablet 2 tabs at first sign of fever blister and 2 tabs 12 hours later   No facility-administered encounter medications on file as of 11/16/2020.     Review of Systems  Constitutional: Negative for chills, fatigue and fever.  HENT: Negative for congestion.   Respiratory: Negative for cough and shortness of breath.    Cardiovascular: Negative for chest pain, palpitations and leg swelling.  Gastrointestinal: Negative for abdominal pain, blood in stool, nausea and vomiting.  Genitourinary: Negative for dysuria and hematuria.  Musculoskeletal: Negative for arthralgias and myalgias.  Skin: Negative for rash.  Neurological: Negative for dizziness, weakness, light-headedness and headaches.  Hematological: Negative for adenopathy.  Psychiatric/Behavioral: Negative for decreased concentration, dysphoric mood, self-injury and suicidal ideas. The patient is not nervous/anxious.      Vitals BP 126/84   Pulse 92   Temp (!) 97.2 F (36.2 C) (Oral)   Ht 5\' 7"  (1.702 m)   Wt 201 lb 3.2 oz (91.3 kg)   SpO2 97%   BMI 31.51 kg/m   Objective:   Physical Exam Vitals reviewed.  Constitutional:      General: She is not in acute distress.    Appearance: Normal appearance.  Cardiovascular:     Rate and Rhythm: Normal rate and regular rhythm.     Heart sounds: Normal heart sounds.  Pulmonary:     Effort: Pulmonary effort is normal.     Breath sounds: Normal breath sounds.  Skin:    General: Skin is warm and dry.  Neurological:     General: No focal deficit present.     Mental Status: She is alert.  Psychiatric:        Behavior: Behavior normal.      Assessment and Plan   1. Essential hypertension  2. Depression, major, single episode, mild (HCC)    Hypertension Medication compliance: taking as  prescribed. Denies chest pain, shortness of breath, lower extremity edema, vision changes, headaches.  Pertinent lab work: Last in June 2021. Kidney function excellent.  ASCVD 10- year risk score: 2.6% Monitoring: every 6 months with labs, encourage self-monitoring Continue current medication regimen: continue amlodipine 5 mg daily.   Depression:  Medication compliance: taking as prescribed Symptoms well-controlled: feels well-controlled PHQ-9: zero  Gad-7: n/a Thoughts of self-harm, suicidal  thoughts: denies Sleep issues: no issues Continue with current regimen: continue citalopram 40 mg daily as prescribed.  Health Maintenance:  Discussed bone density guidelines. Will take calcium and vit D3 1000 iu recommended. Lifestyle modification: diet and exercise recommendation and DASH diet.  We will place referral for Cologuard for colon cancer screening.  Agrees with plan of care discussed today. Understands warning signs to seek further care: chest pain, shortness of breath, feelings of self-harm.  Understands to follow-up in six months for medication management for HTN and depression.      Dorena Bodo, FNP-C

## 2020-11-16 NOTE — Patient Instructions (Signed)

## 2021-01-03 ENCOUNTER — Other Ambulatory Visit: Payer: Self-pay | Admitting: Obstetrics

## 2021-01-03 DIAGNOSIS — Z1231 Encounter for screening mammogram for malignant neoplasm of breast: Secondary | ICD-10-CM

## 2021-02-24 ENCOUNTER — Other Ambulatory Visit: Payer: Self-pay

## 2021-02-24 ENCOUNTER — Ambulatory Visit
Admission: RE | Admit: 2021-02-24 | Discharge: 2021-02-24 | Disposition: A | Discharge status: Home / Self Care | Payer: BC Managed Care – PPO | Source: Ambulatory Visit | Attending: Obstetrics | Admitting: Obstetrics

## 2021-02-24 DIAGNOSIS — Z1231 Encounter for screening mammogram for malignant neoplasm of breast: Secondary | ICD-10-CM

## 2021-02-24 IMAGING — MG DIGITAL SCREENING BREAST BILAT IMPLANT W/ TOMO W/ CAD
8 of 14 series · 8 of 34 positions shown · non-contrast
Comparison: Previous exam(s).

CLINICAL DATA: Screening.

EXAM:
DIGITAL SCREENING BILATERAL MAMMOGRAM WITH IMPLANTS, CAD AND TOMO
The patient has retropectoral implants. Standard and implant
displaced views were performed.

[R MLO]
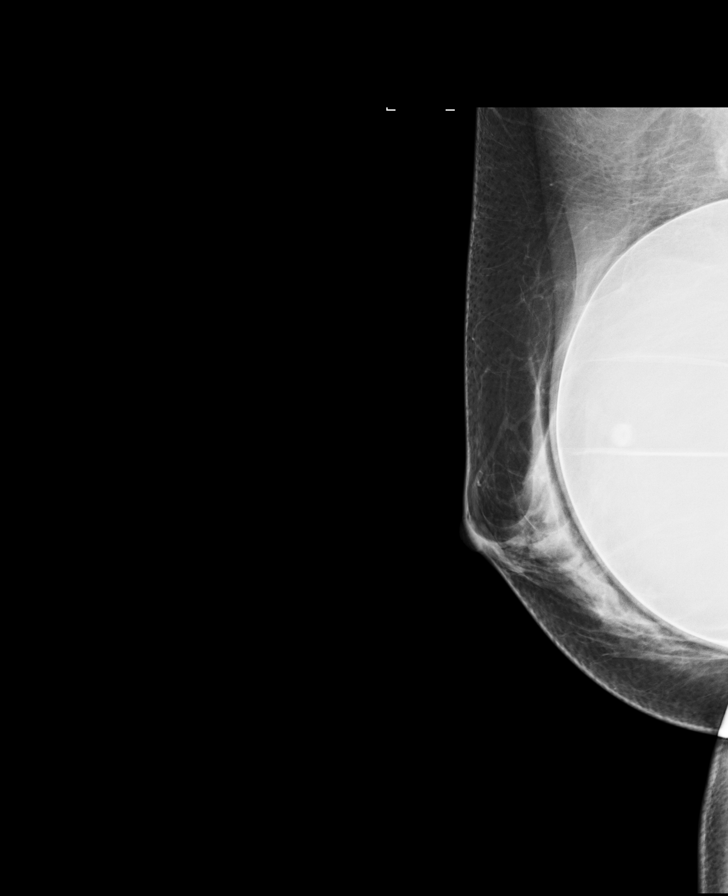

[L CC]
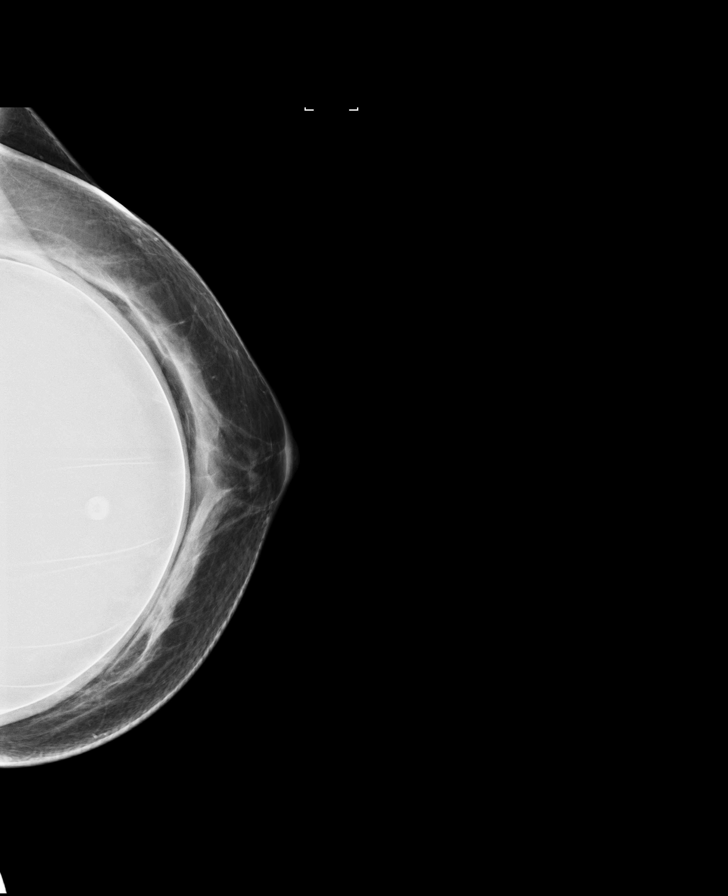

[L MLO]
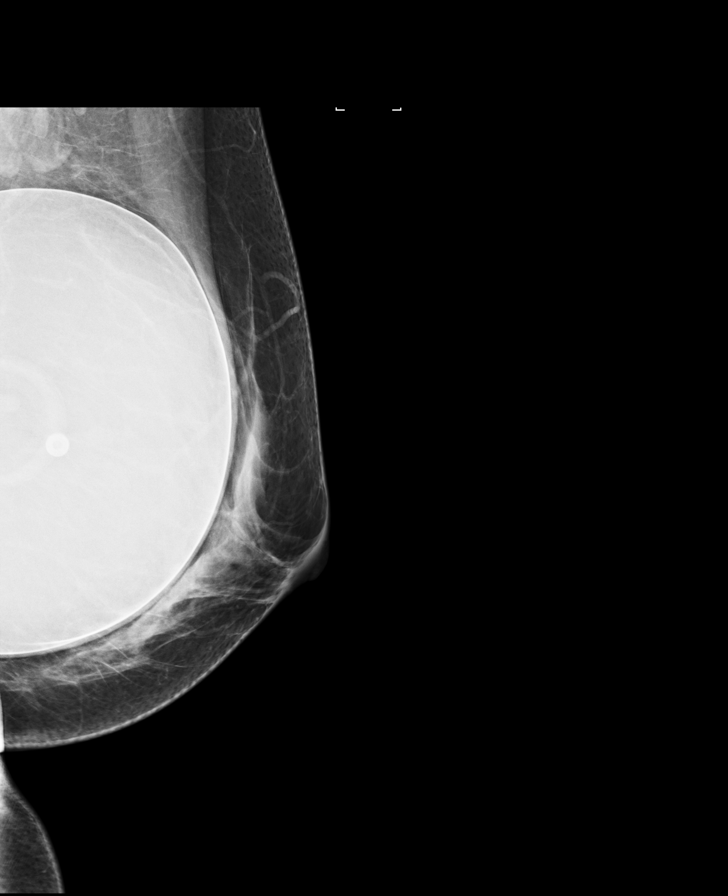

[R CC]
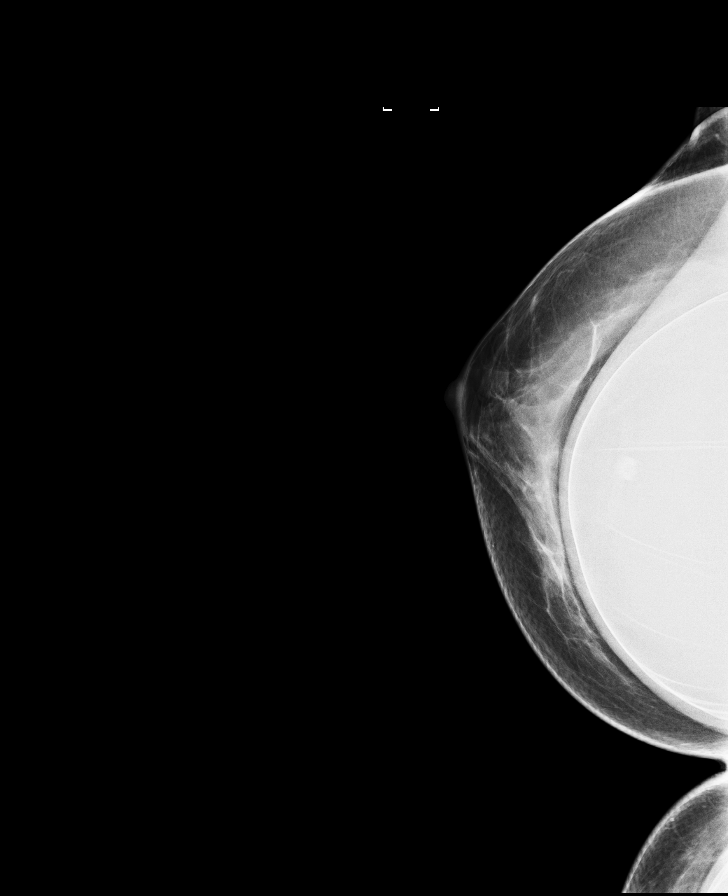

[L CC synth-2D]
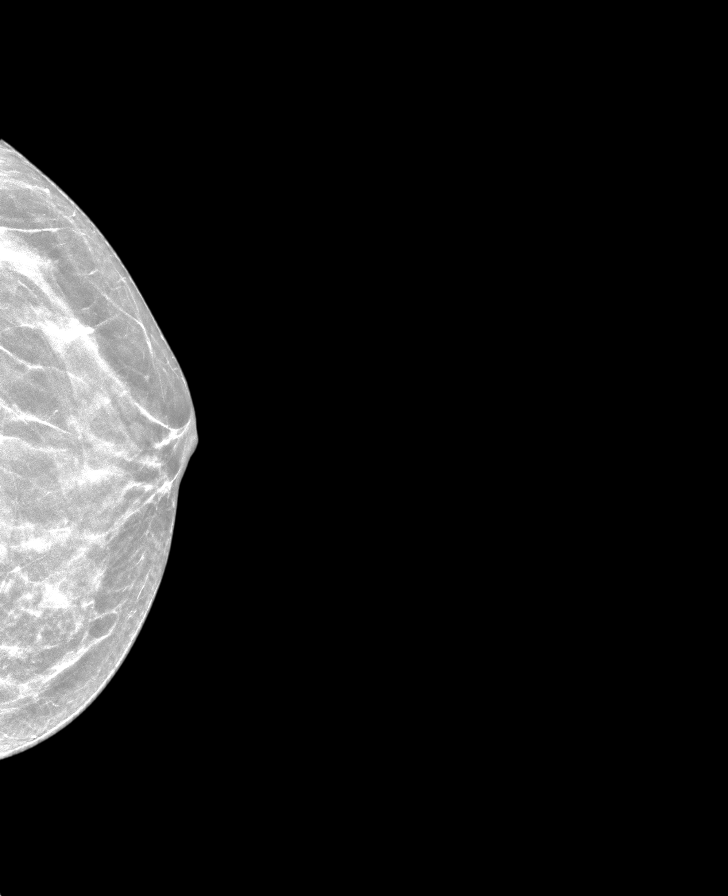

[L MLO synth-2D]
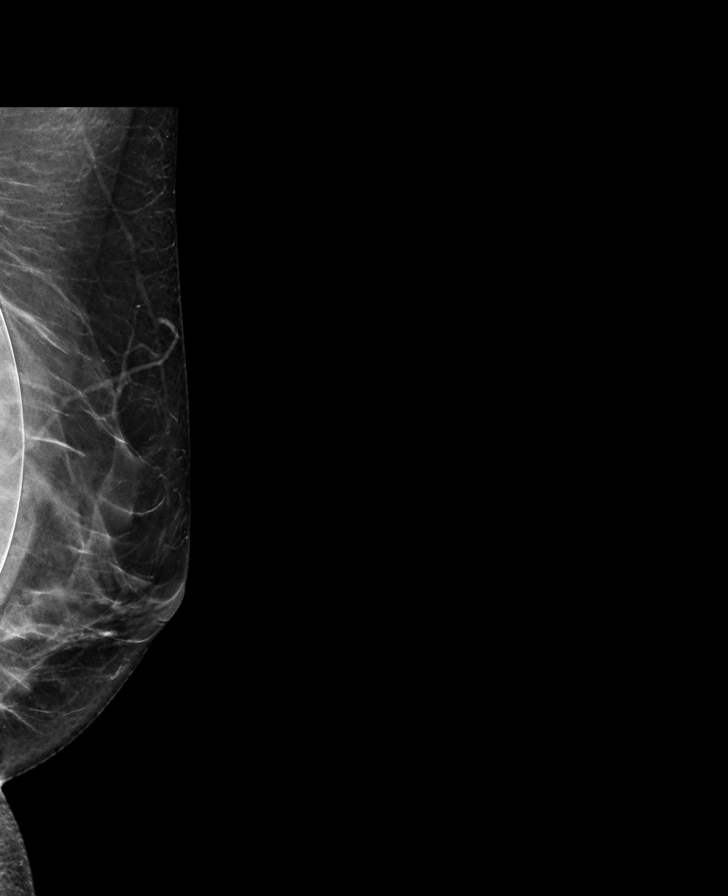

[R MLO synth-2D (1 of 2)]
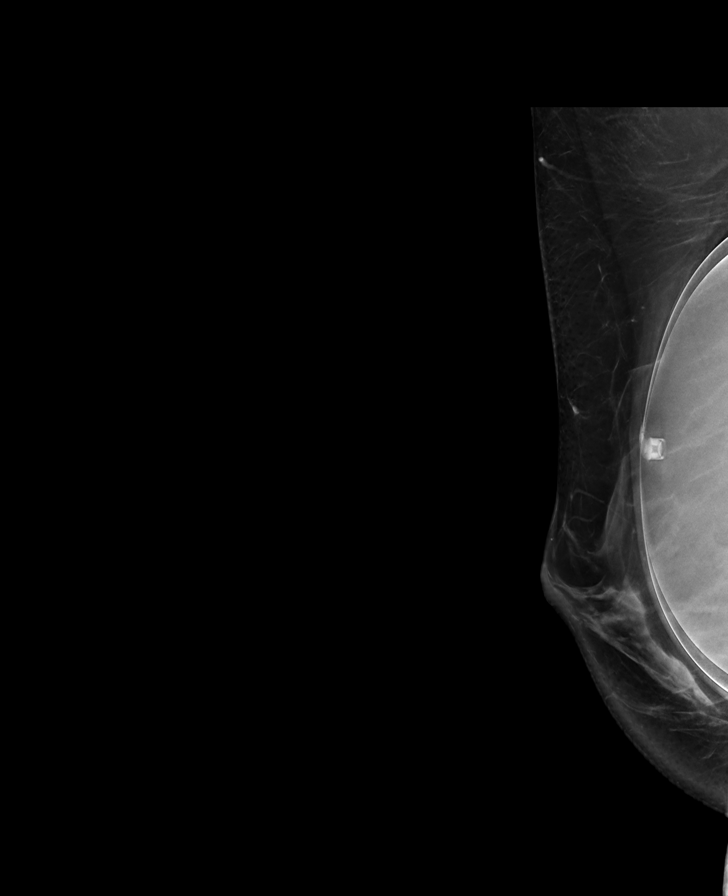

[R MLO synth-2D (2 of 2)]
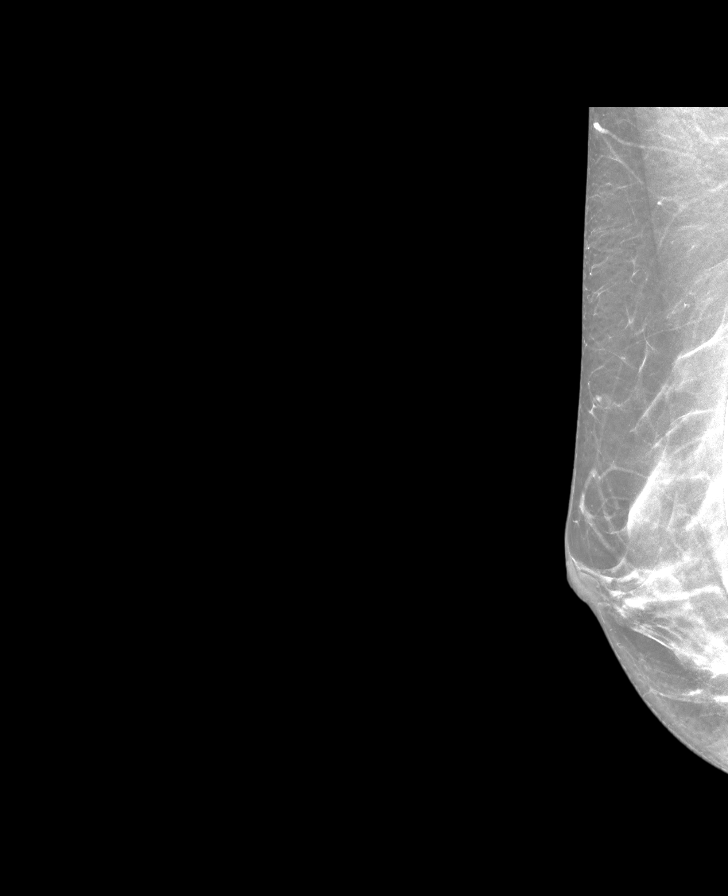

[8 of 34 positions shown; findings below may reference images not displayed]

ACR Breast Density Category b: There are scattered areas of
fibroglandular density.
FINDINGS: There are no findings suspicious for malignancy. Images were
processed with CAD.
IMPRESSION: No mammographic evidence of malignancy. A result letter of this
screening mammogram will be mailed directly to the patient.

RECOMMENDATION:
Screening mammogram in one year. (Code:60-T-8Z4)

BI-RADS CATEGORY  1:  Negative.

## 2021-03-20 DIAGNOSIS — Z1211 Encounter for screening for malignant neoplasm of colon: Secondary | ICD-10-CM

## 2021-03-21 NOTE — Telephone Encounter (Signed)
Ladona Ridgel, Malena M, DO      Pt requesting cologuard?   Pls give order if this is what she is requesting.   Thx.   Dr. Ladona Ridgel

## 2021-03-27 ENCOUNTER — Other Ambulatory Visit: Payer: Self-pay

## 2021-03-27 DIAGNOSIS — Z1211 Encounter for screening for malignant neoplasm of colon: Secondary | ICD-10-CM

## 2021-03-28 ENCOUNTER — Telehealth: Payer: Self-pay

## 2021-03-28 NOTE — Telephone Encounter (Signed)
Cologuard ordered and faxed to [facility] with copy of insurance attached .

## 2021-03-30 ENCOUNTER — Telehealth: Payer: Self-pay | Admitting: Family Medicine

## 2021-04-04 NOTE — Telephone Encounter (Signed)
Sent my chart message to schedule appointment.

## 2021-04-04 NOTE — Telephone Encounter (Signed)
Patient has appointment on 7/12 for medication refill

## 2021-04-11 DIAGNOSIS — Z1211 Encounter for screening for malignant neoplasm of colon: Secondary | ICD-10-CM | POA: Diagnosis not present

## 2021-04-11 LAB — COLOGUARD: Cologuard: NEGATIVE

## 2021-04-17 LAB — COLOGUARD: COLOGUARD: NEGATIVE

## 2021-04-25 ENCOUNTER — Other Ambulatory Visit: Payer: Self-pay | Admitting: Family Medicine

## 2021-04-25 NOTE — Telephone Encounter (Signed)
30 day sent 5/27. Has upcoming appt on 7/12

## 2021-05-16 ENCOUNTER — Other Ambulatory Visit: Payer: Self-pay

## 2021-05-16 ENCOUNTER — Ambulatory Visit: Payer: BC Managed Care – PPO | Admitting: Family Medicine

## 2021-05-16 VITALS — BP 113/74 | HR 83 | Temp 97.1°F | Wt 161.8 lb

## 2021-05-16 DIAGNOSIS — F320 Major depressive disorder, single episode, mild: Secondary | ICD-10-CM

## 2021-05-16 DIAGNOSIS — E785 Hyperlipidemia, unspecified: Secondary | ICD-10-CM

## 2021-05-16 DIAGNOSIS — F411 Generalized anxiety disorder: Secondary | ICD-10-CM

## 2021-05-16 DIAGNOSIS — I10 Essential (primary) hypertension: Secondary | ICD-10-CM

## 2021-05-16 MED ORDER — CITALOPRAM HYDROBROMIDE 40 MG PO TABS: ORAL_TABLET | ORAL | 1 refills | Status: DC

## 2021-05-16 MED ORDER — AMLODIPINE BESYLATE 5 MG PO TABS: 5.0000 mg | ORAL_TABLET | Freq: Every day | ORAL | 1 refills | Status: DC

## 2021-05-16 NOTE — Progress Notes (Signed)
Patient ID: Misty Hayes, female    DOB: 01/09/1971, 50 y.o.   MRN: [id number]   Chief Complaint  Patient presents with   Hypertension   Subjective:    HPI  Pt here for blood pressure follow up. Pt states she does check blood pressure outside of office but not often. Pt is taking Amlodipine 5 mg daily.   HTN Pt compliant with BP meds.  No SEs Denies chest pain, sob, LE swelling, or blurry vision.  Gad/depression- doing well.  No new concerns.  Medical History Kellina has a past medical history of Hyperlipidemia, Insomnia, and Mitral valve prolapse.   Outpatient Encounter Medications as of 05/16/2021  Medication Sig   cetirizine-pseudoephedrine (ZYRTEC-D) 5-120 MG per tablet Take 1 tablet by mouth 2 (two) times daily.   Multiple Vitamin (MULTIVITAMIN) tablet Take 1 tablet by mouth daily.   norethindrone (MICRONOR) 0.35 MG tablet    OVER THE COUNTER MEDICATION Calcium take 2 every day   [DISCONTINUED] amLODipine (NORVASC) 5 MG tablet Take 1 tablet (5 mg total) by mouth daily.   [DISCONTINUED] citalopram (CELEXA) 40 MG tablet TAKE 1 TABLET(40 MG) BY MOUTH DAILY   amLODipine (NORVASC) 5 MG tablet Take 1 tablet (5 mg total) by mouth daily.   citalopram (CELEXA) 40 MG tablet TAKE 1 TABLET(40 MG) BY MOUTH DAILY   [DISCONTINUED] GILDESS FE 1/20 1-20 MG-MCG tablet    No facility-administered encounter medications on file as of 05/16/2021.     Review of Systems  Constitutional:  Negative for chills and fever.  HENT:  Negative for congestion, rhinorrhea and sore throat.   Respiratory:  Negative for cough, shortness of breath and wheezing.   Cardiovascular:  Negative for chest pain and leg swelling.  Gastrointestinal:  Negative for abdominal pain, diarrhea, nausea and vomiting.  Genitourinary:  Negative for dysuria and frequency.  Musculoskeletal:  Negative for arthralgias and back pain.  Skin:  Negative for rash.  Neurological:  Negative for dizziness, weakness and  headaches.    Vitals BP 113/74   Pulse 83   Temp (!) 97.1 F (36.2 C)   Wt 161 lb 12.8 oz (73.4 kg)   SpO2 97%   BMI 25.34 kg/m   Objective:   Physical Exam Vitals and nursing note reviewed.  Constitutional:      Appearance: Normal appearance.  HENT:     Head: Normocephalic and atraumatic.     Nose: Nose normal.     Mouth/Throat:     Mouth: Mucous membranes are moist.     Pharynx: Oropharynx is clear.  Eyes:     Extraocular Movements: Extraocular movements intact.     Conjunctiva/sclera: Conjunctivae normal.     Pupils: Pupils are equal, round, and reactive to light.  Cardiovascular:     Rate and Rhythm: Normal rate and regular rhythm.     Pulses: Normal pulses.     Heart sounds: Normal heart sounds.  Pulmonary:     Effort: Pulmonary effort is normal.     Breath sounds: Normal breath sounds. No wheezing, rhonchi or rales.  Musculoskeletal:        General: Normal range of motion.     Right lower leg: No edema.     Left lower leg: No edema.  Skin:    General: Skin is warm and dry.     Findings: No lesion or rash.  Neurological:     General: No focal deficit present.     Mental Status: She is alert and oriented  to person, place, and time.  Psychiatric:        Mood and Affect: Mood normal.        Behavior: Behavior normal.     Assessment and Plan   1. Essential hypertension - CBC - CMP14+EGFR - Lipid panel - amLODipine (NORVASC) 5 MG tablet; Take 1 tablet (5 mg total) by mouth daily.  Dispense: 90 tablet; Refill: 1  2. Hyperlipidemia LDL goal <130 - Lipid panel  3. Generalized anxiety disorder - citalopram (CELEXA) 40 MG tablet; TAKE 1 TABLET(40 MG) BY MOUTH DAILY  Dispense: 90 tablet; Refill: 1  4. Depression, major, single episode, mild (HCC) - citalopram (CELEXA) 40 MG tablet; TAKE 1 TABLET(40 MG) BY MOUTH DAILY  Dispense: 90 tablet; Refill: 1   Had cologuard negative.  Tdap at gyn office, and is up to date.  Not wanting shingles vaccine.  Htn-  stable. Cont meds.  Hld- recheck labs.  Gad/depression- stable. Cont meds.    Return in about 6 months (around 11/16/2021) for f/u htn, anxiety.

## 2021-05-17 LAB — CMP14+EGFR
ALT: 17 IU/L (ref 0–32)
AST: 16 IU/L (ref 0–40)
Albumin/Globulin Ratio: 1.6 (ref 1.2–2.2)
Albumin: 4.5 g/dL (ref 3.8–4.9)
Alkaline Phosphatase: 170 IU/L — ABNORMAL HIGH (ref 44–121)
BUN/Creatinine Ratio: 13 (ref 9–23)
BUN: 11 mg/dL (ref 6–24)
Bilirubin Total: 0.5 mg/dL (ref 0.0–1.2)
CO2: 24 mmol/L (ref 20–29)
Calcium: 9.6 mg/dL (ref 8.7–10.2)
Chloride: 98 mmol/L (ref 96–106)
Creatinine, Ser: 0.85 mg/dL (ref 0.57–1.00)
Globulin, Total: 2.8 g/dL (ref 1.5–4.5)
Glucose: 115 mg/dL — ABNORMAL HIGH (ref 65–99)
Potassium: 4.2 mmol/L (ref 3.5–5.2)
Sodium: 141 mmol/L (ref 134–144)
Total Protein: 7.3 g/dL (ref 6.0–8.5)
eGFR: 83 mL/min/{1.73_m2} (ref >59)

## 2021-05-17 LAB — CBC
Hematocrit: 40.7 % (ref 34.0–46.6)
Hemoglobin: 13.2 g/dL (ref 11.1–15.9)
MCH: 28.8 pg (ref 26.6–33.0)
MCHC: 32.4 g/dL (ref 31.5–35.7)
MCV: 89 fL (ref 79–97)
Platelets: 289 10*3/uL (ref 150–450)
RBC: 4.59 x10E6/uL (ref 3.77–5.28)
RDW: 13.6 % (ref 11.7–15.4)
WBC: 6.7 10*3/uL (ref 3.4–10.8)

## 2021-05-17 LAB — LIPID PANEL
Chol/HDL Ratio: 5.6 ratio — ABNORMAL HIGH (ref 0.0–4.4)
Cholesterol, Total: 206 mg/dL — ABNORMAL HIGH (ref 100–199)
HDL: 37 mg/dL — ABNORMAL LOW (ref >39)
LDL Chol Calc (NIH): 152 mg/dL — ABNORMAL HIGH (ref 0–99)
Triglycerides: 91 mg/dL (ref 0–149)
VLDL Cholesterol Cal: 17 mg/dL (ref 5–40)

## 2021-05-18 ENCOUNTER — Encounter: Payer: Self-pay | Admitting: Family Medicine

## 2021-05-18 DIAGNOSIS — R748 Abnormal levels of other serum enzymes: Secondary | ICD-10-CM | POA: Insufficient documentation

## 2021-06-18 ENCOUNTER — Other Ambulatory Visit: Payer: Self-pay | Admitting: Family Medicine

## 2021-06-18 DIAGNOSIS — I10 Essential (primary) hypertension: Secondary | ICD-10-CM

## 2021-09-08 ENCOUNTER — Encounter: Payer: Self-pay | Admitting: Nurse Practitioner

## 2021-09-08 ENCOUNTER — Ambulatory Visit (INDEPENDENT_AMBULATORY_CARE_PROVIDER_SITE_OTHER): Payer: BC Managed Care – PPO | Admitting: Nurse Practitioner

## 2021-09-08 ENCOUNTER — Other Ambulatory Visit: Payer: Self-pay

## 2021-09-08 VITALS — Temp 98.2°F

## 2021-09-08 DIAGNOSIS — J019 Acute sinusitis, unspecified: Secondary | ICD-10-CM

## 2021-09-08 DIAGNOSIS — B9689 Other specified bacterial agents as the cause of diseases classified elsewhere: Secondary | ICD-10-CM | POA: Diagnosis not present

## 2021-09-08 MED ORDER — AMOXICILLIN-POT CLAVULANATE 875-125 MG PO TABS: 1.0000 | ORAL_TABLET | Freq: Two times a day (BID) | ORAL | 0 refills | Status: DC

## 2021-09-08 NOTE — Progress Notes (Signed)
   Subjective:    Patient ID: Misty Hayes, female    DOB: Feb 28, 1971, 50 y.o.   MRN: [id number]  Presents for complaints of intense sinus pressure for the past 3 days.  Mainly on the left side.  Radiating into the teeth.  States pain was so bad last night she had difficulty sleeping.  No fever.  Scratchy throat.  Occasional ear pain.  Slight cough nonproductive.  Postnasal drainage.  Has not done as well today drinking fluids.  Voiding normal limit.  Has been using Afrin for the past 3 days has increased the use to 3 times a day.   Review of Systems  HENT:  Positive for congestion and sinus pressure.   Neurological:  Positive for headaches.      Objective:   Physical Exam NAD.  Alert, oriented.  Right TM significant clear effusion, no erythema.  Left TM retracted, no erythema.  Nasal mucosa clear.  Pharynx injected with PND noted.  Neck supple with mild soft anterior adenopathy.  Lungs clear.  Heart regular rate and rhythm.  Today's Vitals   09/08/21 1435  Temp: 98.2 F (36.8 C)  TempSrc: Oral   There is no height or weight on file to calculate BMI.        Assessment & Plan:  Acute bacterial rhinosinusitis Meds ordered this encounter  Medications   amoxicillin-clavulanate (AUGMENTIN) 875-125 MG tablet    Sig: Take 1 tablet by mouth 2 (two) times daily.    Dispense:  20 tablet    Refill:  0    Order Specific Question:   Supervising Provider    Answer:   Lilyan Punt A [[id number]]   Hold on any further use of Afrin and switch to steroid nasal spray OTC.  Call back in 72 hours if no improvement, consider taking oral prednisone taper at that time. Recheck if worsens or persist.

## 2021-09-10 ENCOUNTER — Other Ambulatory Visit: Payer: Self-pay | Admitting: Family Medicine

## 2021-09-10 DIAGNOSIS — I10 Essential (primary) hypertension: Secondary | ICD-10-CM

## 2021-10-17 DIAGNOSIS — Z6825 Body mass index (BMI) 25.0-25.9, adult: Secondary | ICD-10-CM | POA: Diagnosis not present

## 2021-10-17 DIAGNOSIS — Z01419 Encounter for gynecological examination (general) (routine) without abnormal findings: Secondary | ICD-10-CM | POA: Diagnosis not present

## 2021-11-07 ENCOUNTER — Other Ambulatory Visit: Payer: Self-pay

## 2021-11-07 ENCOUNTER — Encounter: Payer: Self-pay | Admitting: Family Medicine

## 2021-11-07 ENCOUNTER — Ambulatory Visit: Payer: BC Managed Care – PPO | Admitting: Family Medicine

## 2021-11-07 VITALS — BP 118/70 | HR 62 | Temp 98.4°F | Wt 165.2 lb

## 2021-11-07 DIAGNOSIS — E785 Hyperlipidemia, unspecified: Secondary | ICD-10-CM | POA: Insufficient documentation

## 2021-11-07 DIAGNOSIS — F320 Major depressive disorder, single episode, mild: Secondary | ICD-10-CM | POA: Diagnosis not present

## 2021-11-07 DIAGNOSIS — I10 Essential (primary) hypertension: Secondary | ICD-10-CM | POA: Diagnosis not present

## 2021-11-07 DIAGNOSIS — L309 Dermatitis, unspecified: Secondary | ICD-10-CM

## 2021-11-07 MED ORDER — TRIAMCINOLONE ACETONIDE 0.5 % EX OINT: 1.0000 "application " | TOPICAL_OINTMENT | Freq: Two times a day (BID) | CUTANEOUS | 0 refills | Status: DC

## 2021-11-07 NOTE — Patient Instructions (Signed)
Follow up in 6 months.  Medication as prescribed.  Take care  Dr. Adriana Simas

## 2021-11-07 NOTE — Assessment & Plan Note (Signed)
Treating with triamcinolone. 

## 2021-11-07 NOTE — Assessment & Plan Note (Signed)
Well-controlled currently.  Continue Celexa.

## 2021-11-07 NOTE — Progress Notes (Signed)
Subjective:  Patient ID: Misty Hayes, female    DOB: 1971/06/11  Age: 51 y.o. MRN: [id number]  CC: Chief Complaint  Patient presents with   Establish Care    Pt would like something for eczema on hands/fingers    HPI:  51 year old female with depression and anxiety, hypertension, hyperlipidemia presents for follow-up and to establish care.  Patient states that she suffers from eczema.  She has some eczema currently on her hands and fingers.  Worsens in the winter.  She would like some topical medication to treat.  Patient's depression is well controlled at this time.  She is currently on Celexa and doing well.  PHQ-9 is 0 today.  Patient's hypertension is well controlled on amlodipine.  Patient uses Zyrtec-D on a daily basis.  I advised against the use of pseudoephedrine.  Patient Active Problem List   Diagnosis Date Noted   Hyperlipidemia 11/07/2021   Eczema 11/07/2021   Elevated alkaline phosphatase level 05/18/2021   Depression, major, single episode, mild ([place]) 11/16/2020   Essential hypertension 06/10/2018   Generalized anxiety disorder 04/10/2016   Mitral valve prolapse 02/01/2013   Insomnia 01/30/2013    Social Hx   Social History   Socioeconomic History   Marital status: Married    Spouse name: Not on file   Number of children: Not on file   Years of education: Not on file   Highest education level: Not on file  Occupational History   Not on file  Tobacco Use   Smoking status: Never   Smokeless tobacco: Never  Substance and Sexual Activity   Alcohol use: Not on file   Drug use: Not on file   Sexual activity: Not on file  Other Topics Concern   Not on file  Social History Narrative   Not on file   Social Determinants of Health   Financial Resource Strain: Not on file  Food Insecurity: Not on file  Transportation Needs: Not on file  Physical Activity: Not on file  Stress: Not on file  Social Connections: Not on file    Review of  Systems Per HPI  Objective:  BP 118/70    Pulse 62    Temp 98.4 F (36.9 C)    Wt 165 lb 3.2 oz (74.9 kg)    SpO2 98%    BMI 25.87 kg/m   BP/Weight 11/07/2021 05/16/2021 [id number]  Systolic BP 123456 123456 [id number]  Diastolic BP 70 74 84  Wt. (Lbs) 165.2 161.8 201.2  BMI 25.87 25.34 31.51    Physical Exam Vitals and nursing note reviewed.  Constitutional:      Appearance: Normal appearance.  HENT:     Head: Normocephalic and atraumatic.  Eyes:     General:        Right eye: No discharge.        Left eye: No discharge.     Conjunctiva/sclera: Conjunctivae normal.  Cardiovascular:     Rate and Rhythm: Normal rate and regular rhythm.  Pulmonary:     Effort: Pulmonary effort is normal.     Breath sounds: Normal breath sounds. No wheezing, rhonchi or rales.  Skin:    Comments: Left middle finger with dryness and cracking distally.  Neurological:     Mental Status: She is alert.  Psychiatric:        Mood and Affect: Mood normal.        Behavior: Behavior normal.    Lab Results  Component Value Date   WBC 6.7  05/16/2021   HGB 13.2 05/16/2021   HCT 40.7 05/16/2021   PLT 289 05/16/2021   GLUCOSE 115 (H) 05/16/2021   CHOL 206 (H) 05/16/2021   TRIG 91 05/16/2021   HDL 37 (L) 05/16/2021   LDLCALC 152 (H) 05/16/2021   ALT 17 05/16/2021   AST 16 05/16/2021   NA 141 05/16/2021   K 4.2 05/16/2021   CL 98 05/16/2021   CREATININE 0.85 05/16/2021   BUN 11 05/16/2021   CO2 24 05/16/2021   TSH 2.130 04/19/2020     Assessment & Plan:   Problem List Items Addressed This Visit       Cardiovascular and Mediastinum   Essential hypertension    Well-controlled.  Continue amlodipine.        Musculoskeletal and Integument   Eczema    Treating with triamcinolone.        Other   Depression, major, single episode, mild ([place])    Well-controlled currently.  Continue Celexa.       Meds ordered this encounter  Medications   triamcinolone ointment (KENALOG) 0.5 %    Sig: Apply  1 application topically 2 (two) times daily.    Dispense:  30 g    Refill:  0    Follow-up:  Return in about 6 months (around 05/07/2022).  [place]

## 2021-11-07 NOTE — Assessment & Plan Note (Signed)
Well controlled. Continue amlodipine 

## 2021-11-10 ENCOUNTER — Other Ambulatory Visit: Payer: Self-pay | Admitting: Family Medicine

## 2021-11-10 DIAGNOSIS — F411 Generalized anxiety disorder: Secondary | ICD-10-CM

## 2021-11-10 DIAGNOSIS — F320 Major depressive disorder, single episode, mild: Secondary | ICD-10-CM

## 2022-03-21 ENCOUNTER — Other Ambulatory Visit: Payer: Self-pay | Admitting: Obstetrics

## 2022-03-21 DIAGNOSIS — Z1231 Encounter for screening mammogram for malignant neoplasm of breast: Secondary | ICD-10-CM

## 2022-04-03 ENCOUNTER — Ambulatory Visit
Admission: RE | Admit: 2022-04-03 | Discharge: 2022-04-03 | Disposition: A | Discharge status: Home / Self Care | Payer: BC Managed Care – PPO | Source: Ambulatory Visit | Attending: Obstetrics | Admitting: Obstetrics

## 2022-04-03 DIAGNOSIS — Z1231 Encounter for screening mammogram for malignant neoplasm of breast: Secondary | ICD-10-CM | POA: Diagnosis not present

## 2022-05-07 ENCOUNTER — Ambulatory Visit: Payer: BC Managed Care – PPO | Admitting: Family Medicine

## 2022-05-07 VITALS — BP 116/79 | HR 71 | Temp 97.8°F | Ht 67.0 in | Wt 179.8 lb

## 2022-05-07 DIAGNOSIS — I10 Essential (primary) hypertension: Secondary | ICD-10-CM | POA: Diagnosis not present

## 2022-05-07 DIAGNOSIS — F320 Major depressive disorder, single episode, mild: Secondary | ICD-10-CM

## 2022-05-07 DIAGNOSIS — E7800 Pure hypercholesterolemia, unspecified: Secondary | ICD-10-CM | POA: Diagnosis not present

## 2022-05-07 DIAGNOSIS — F411 Generalized anxiety disorder: Secondary | ICD-10-CM

## 2022-05-07 MED ORDER — CITALOPRAM HYDROBROMIDE 40 MG PO TABS: ORAL_TABLET | ORAL | 1 refills | Status: DC

## 2022-05-07 MED ORDER — ROSUVASTATIN CALCIUM 10 MG PO TABS: 10.0000 mg | ORAL_TABLET | Freq: Every day | ORAL | 3 refills | Status: DC

## 2022-05-07 MED ORDER — FLUTICASONE PROPIONATE 50 MCG/ACT NA SUSP: 2.0000 | Freq: Every day | NASAL | 2 refills | Status: AC

## 2022-05-07 MED ORDER — AMLODIPINE BESYLATE 5 MG PO TABS: 5.0000 mg | ORAL_TABLET | Freq: Every day | ORAL | 0 refills | Status: DC

## 2022-05-07 NOTE — Patient Instructions (Signed)
Medications as prescribed.  Labs in 3 months to assess cholesterol and liver enzymes.  Follow up in 6 months.  Take care  Dr. Adriana Simas

## 2022-05-07 NOTE — Assessment & Plan Note (Signed)
Stable ?Continue Celexa ?

## 2022-05-07 NOTE — Assessment & Plan Note (Signed)
Stable. °-Continue amlodipine °

## 2022-05-07 NOTE — Assessment & Plan Note (Signed)
Uncontrolled, worsening.  Placing on Crestor.

## 2022-05-07 NOTE — Progress Notes (Signed)
Subjective:  Patient ID: Misty Hayes, female    DOB: [birth year]  Age: 51 y.o. MRN: [id number]  CC: Chief Complaint  Patient presents with   Depression    HPI:  51 year old female with hypertension, hyperlipidemia, depression and anxiety presents for follow-up.  Patient has a recent labs with her.  These are not available in the EMR.  Notable for significant hyperlipidemia with an LDL of 180.  We will discuss treatment options today.  Hypertension is well controlled on amlodipine.  Depression and anxiety stable on Celexa.  Patient is using Flonase for allergy symptoms.  Would like this to be sent to the pharmacy.  Patient Active Problem List   Diagnosis Date Noted   Hyperlipidemia 11/07/2021   Eczema 11/07/2021   Elevated alkaline phosphatase level 05/18/2021   Depression, major, single episode, mild ([place]) 11/16/2020   Essential hypertension 06/10/2018   Generalized anxiety disorder 04/10/2016   Insomnia 01/30/2013    Social Hx   Social History   Socioeconomic History   Marital status: Married    Spouse name: Not on file   Number of children: Not on file   Years of education: Not on file   Highest education level: Not on file  Occupational History   Not on file  Tobacco Use   Smoking status: Never   Smokeless tobacco: Never  Substance and Sexual Activity   Alcohol use: Not on file   Drug use: Not on file   Sexual activity: Not on file  Other Topics Concern   Not on file  Social History Narrative   Not on file   Social Determinants of Health   Financial Resource Strain: Not on file  Food Insecurity: Not on file  Transportation Needs: Not on file  Physical Activity: Not on file  Stress: Not on file  Social Connections: Not on file    Review of Systems  Constitutional: Negative.   HENT:  Positive for congestion.   Psychiatric/Behavioral:         Stress @ work.   Objective:  BP 116/79   Pulse 71   Temp 97.8 F (36.6 C) (Oral)   Ht 5' 7"   (1.702 m)   Wt 179 lb 12.8 oz (81.6 kg)   SpO2 98%   BMI 28.16 kg/m      05/07/2022    8:23 AM 11/07/2021   10:34 AM 05/16/2021    8:45 AM  BP/Weight  Systolic BP 323 557 322  Diastolic BP 79 70 74  Wt. (Lbs) 179.8 165.2 161.8  BMI 28.16 kg/m2 25.87 kg/m2 25.34 kg/m2    Physical Exam Vitals and nursing note reviewed.  Constitutional:      General: She is not in acute distress.    Appearance: Normal appearance.  HENT:     Head: Normocephalic and atraumatic.  Eyes:     General:        Right eye: No discharge.        Left eye: No discharge.     Conjunctiva/sclera: Conjunctivae normal.  Cardiovascular:     Rate and Rhythm: Normal rate and regular rhythm.  Pulmonary:     Effort: Pulmonary effort is normal.     Breath sounds: Normal breath sounds. No wheezing, rhonchi or rales.  Neurological:     Mental Status: She is alert.  Psychiatric:        Mood and Affect: Mood normal.        Behavior: Behavior normal.     Lab Results  Component Value Date   WBC 6.7 05/16/2021   HGB 13.2 05/16/2021   HCT 40.7 05/16/2021   PLT 289 05/16/2021   GLUCOSE 115 (H) 05/16/2021   CHOL 206 (H) 05/16/2021   TRIG 91 05/16/2021   HDL 37 (L) 05/16/2021   LDLCALC 152 (H) 05/16/2021   ALT 17 05/16/2021   AST 16 05/16/2021   NA 141 05/16/2021   K 4.2 05/16/2021   CL 98 05/16/2021   CREATININE 0.85 05/16/2021   BUN 11 05/16/2021   CO2 24 05/16/2021   TSH 2.130 04/19/2020     Assessment & Plan:   Problem List Items Addressed This Visit       Cardiovascular and Mediastinum   Essential hypertension - Primary    Stable.  Continue amlodipine.      Relevant Medications   amLODipine (NORVASC) 5 MG tablet   rosuvastatin (CRESTOR) 10 MG tablet   Other Relevant Orders   CMP14+EGFR     Other   Generalized anxiety disorder   Relevant Medications   citalopram (CELEXA) 40 MG tablet   Depression, major, single episode, mild ([facility])    Stable.  Continue Celexa.      Relevant  Medications   citalopram (CELEXA) 40 MG tablet   Hyperlipidemia    Uncontrolled, worsening.  Placing on Crestor.      Relevant Medications   amLODipine (NORVASC) 5 MG tablet   rosuvastatin (CRESTOR) 10 MG tablet   Other Relevant Orders   Lipid panel    Meds ordered this encounter  Medications   amLODipine (NORVASC) 5 MG tablet    Sig: Take 1 tablet (5 mg total) by mouth daily.    Dispense:  90 tablet    Refill:  0   citalopram (CELEXA) 40 MG tablet    Sig: TAKE 1 TABLET BY MOUTH DAILY    Dispense:  90 tablet    Refill:  1   rosuvastatin (CRESTOR) 10 MG tablet    Sig: Take 1 tablet (10 mg total) by mouth daily.    Dispense:  90 tablet    Refill:  3   fluticasone (FLONASE) 50 MCG/ACT nasal spray    Sig: Place 2 sprays into both nostrils daily.    Dispense:  16 g    Refill:  2    Follow-up: 6 months  [place] DO [place]

## 2022-05-17 ENCOUNTER — Other Ambulatory Visit: Payer: Self-pay | Admitting: Family Medicine

## 2022-05-17 DIAGNOSIS — I10 Essential (primary) hypertension: Secondary | ICD-10-CM

## 2022-08-13 ENCOUNTER — Other Ambulatory Visit: Payer: Self-pay

## 2022-08-13 DIAGNOSIS — I10 Essential (primary) hypertension: Secondary | ICD-10-CM

## 2022-08-13 MED ORDER — AMLODIPINE BESYLATE 5 MG PO TABS: 5.0000 mg | ORAL_TABLET | Freq: Every day | ORAL | 0 refills | Status: DC

## 2022-10-01 DIAGNOSIS — I10 Essential (primary) hypertension: Secondary | ICD-10-CM | POA: Diagnosis not present

## 2022-10-01 DIAGNOSIS — E7800 Pure hypercholesterolemia, unspecified: Secondary | ICD-10-CM | POA: Diagnosis not present

## 2022-10-02 LAB — LIPID PANEL
Chol/HDL Ratio: 3.3 ratio (ref 0.0–4.4)
Cholesterol, Total: 137 mg/dL (ref 100–199)
HDL: 41 mg/dL (ref >39)
LDL Chol Calc (NIH): 72 mg/dL (ref 0–99)
Triglycerides: 138 mg/dL (ref 0–149)
VLDL Cholesterol Cal: 24 mg/dL (ref 5–40)

## 2022-10-02 LAB — CMP14+EGFR
ALT: 21 IU/L (ref 0–32)
AST: 22 IU/L (ref 0–40)
Albumin/Globulin Ratio: 1.6 (ref 1.2–2.2)
Albumin: 4.1 g/dL (ref 3.8–4.9)
Alkaline Phosphatase: 99 IU/L (ref 44–121)
BUN/Creatinine Ratio: 11 (ref 9–23)
BUN: 11 mg/dL (ref 6–24)
Bilirubin Total: 0.4 mg/dL (ref 0.0–1.2)
CO2: 24 mmol/L (ref 20–29)
Calcium: 9.2 mg/dL (ref 8.7–10.2)
Chloride: 105 mmol/L (ref 96–106)
Creatinine, Ser: 0.96 mg/dL (ref 0.57–1.00)
Globulin, Total: 2.6 g/dL (ref 1.5–4.5)
Glucose: 107 mg/dL — ABNORMAL HIGH (ref 70–99)
Potassium: 3.9 mmol/L (ref 3.5–5.2)
Sodium: 142 mmol/L (ref 134–144)
Total Protein: 6.7 g/dL (ref 6.0–8.5)
eGFR: 71 mL/min/{1.73_m2} (ref >59)

## 2022-10-12 ENCOUNTER — Encounter: Payer: Self-pay | Admitting: *Deleted

## 2022-10-16 ENCOUNTER — Other Ambulatory Visit: Payer: Self-pay | Admitting: Family Medicine

## 2022-10-16 DIAGNOSIS — F320 Major depressive disorder, single episode, mild: Secondary | ICD-10-CM

## 2022-10-16 DIAGNOSIS — F411 Generalized anxiety disorder: Secondary | ICD-10-CM

## 2022-10-23 DIAGNOSIS — Z124 Encounter for screening for malignant neoplasm of cervix: Secondary | ICD-10-CM | POA: Diagnosis not present

## 2022-10-23 DIAGNOSIS — Z01419 Encounter for gynecological examination (general) (routine) without abnormal findings: Secondary | ICD-10-CM | POA: Diagnosis not present

## 2022-10-23 DIAGNOSIS — Z6831 Body mass index (BMI) 31.0-31.9, adult: Secondary | ICD-10-CM | POA: Diagnosis not present

## 2022-11-19 ENCOUNTER — Telehealth: Payer: No Typology Code available for payment source | Admitting: Nurse Practitioner

## 2022-11-19 ENCOUNTER — Encounter: Payer: Self-pay | Admitting: Family Medicine

## 2022-11-19 ENCOUNTER — Other Ambulatory Visit: Payer: Self-pay | Admitting: Family Medicine

## 2022-11-19 DIAGNOSIS — I10 Essential (primary) hypertension: Secondary | ICD-10-CM

## 2022-11-19 DIAGNOSIS — J0110 Acute frontal sinusitis, unspecified: Secondary | ICD-10-CM

## 2022-11-19 MED ORDER — FLUTICASONE PROPIONATE 50 MCG/ACT NA SUSP: 2.0000 | Freq: Every day | NASAL | 6 refills | Status: AC

## 2022-11-19 MED ORDER — AMOXICILLIN-POT CLAVULANATE 875-125 MG PO TABS: 1.0000 | ORAL_TABLET | Freq: Two times a day (BID) | ORAL | 0 refills | Status: AC

## 2022-11-19 NOTE — Progress Notes (Signed)
Virtual Visit Consent   Misty Hayes, you are scheduled for a virtual visit with a [facility] provider today. Just as with appointments in the office, your consent must be obtained to participate. Your consent will be active for this visit and any virtual visit you may have with one of our providers in the next 365 days. If you have a MyChart account, a copy of this consent can be sent to you electronically.  As this is a virtual visit, video technology does not allow for your provider to perform a traditional examination. This may limit your provider's ability to fully assess your condition. If your provider identifies any concerns that need to be evaluated in person or the need to arrange testing (such as labs, EKG, etc.), we will make arrangements to do so. Although advances in technology are sophisticated, we cannot ensure that it will always work on either your end or our end. If the connection with a video visit is poor, the visit may have to be switched to a telephone visit. With either a video or telephone visit, we are not always able to ensure that we have a secure connection.  By engaging in this virtual visit, you consent to the provision of healthcare and authorize for your insurance to be billed (if applicable) for the services provided during this visit. Depending on your insurance coverage, you may receive a charge related to this service.  I need to obtain your verbal consent now. Are you willing to proceed with your visit today? Onisha Aalaysia Liggins has provided verbal consent on 11/19/2022 for a virtual visit (video or telephone). Apolonio Schneiders, FNP  Date: 11/19/2022 5:13 PM  Virtual Visit via Video Note   I, Apolonio Schneiders, connected with  Morgen Linebaugh  ([id number], April 21, 1971) on 11/19/22 at  5:15 PM EST by a video-enabled telemedicine application and verified that I am speaking with the correct person using two identifiers.  Location: Patient: Virtual Visit Location  Patient: Home Provider: Virtual Visit Location Provider: Home Office   I discussed the limitations of evaluation and management by telemedicine and the availability of in person appointments. The patient expressed understanding and agreed to proceed.    History of Present Illness: Misty Hayes is a 52 y.o. who identifies as a female who was assigned female at birth, and is being seen today with complaints of headaches, nasal congestion, sore throat and nose bleeds  She had this for about 10 days, ended last week and then returned a few days ago   She felt like she had a fever earlier today   She has had sinus infections in the past  Has not been tested for COVID    Problems:  Patient Active Problem List   Diagnosis Date Noted   Hyperlipidemia 11/07/2021   Eczema 11/07/2021   Elevated alkaline phosphatase level 05/18/2021   Depression, major, single episode, mild ([place]) 11/16/2020   Essential hypertension 06/10/2018   Generalized anxiety disorder 04/10/2016   Insomnia 01/30/2013    Allergies: No Known Allergies Medications:  Current Outpatient Medications:    amLODipine (NORVASC) 5 MG tablet, Take 1 tablet (5 mg total) by mouth daily., Disp: 90 tablet, Rfl: 0   citalopram (CELEXA) 40 MG tablet, TAKE 1 TABLET BY MOUTH DAILY, Disp: 90 tablet, Rfl: 1   fluticasone (FLONASE) 50 MCG/ACT nasal spray, Place 2 sprays into both nostrils daily., Disp: 16 g, Rfl: 2   Multiple Vitamin (MULTIVITAMIN) tablet, Take 1 tablet by mouth daily., Disp: ,  Rfl:    norethindrone (MICRONOR) 0.35 MG tablet, , Disp: , Rfl:    OVER THE COUNTER MEDICATION, Calcium take 2 every day, Disp: , Rfl:    rosuvastatin (CRESTOR) 10 MG tablet, Take 1 tablet (10 mg total) by mouth daily., Disp: 90 tablet, Rfl: 3  Observations/Objective: Patient is well-developed, well-nourished in no acute distress.  Resting comfortably  at home.  Head is normocephalic, atraumatic.  No labored breathing.  Speech is clear and  coherent with logical content.  Patient is alert and oriented at baseline.    Assessment and Plan: 1. Acute non-recurrent frontal sinusitis May also use Saline Nasal spray as well   - amoxicillin-clavulanate (AUGMENTIN) 875-125 MG tablet; Take 1 tablet by mouth 2 (two) times daily for 7 days. Take with food  Dispense: 14 tablet; Refill: 0 - fluticasone (FLONASE) 50 MCG/ACT nasal spray; Place 2 sprays into both nostrils daily.  Dispense: 16 g; Refill: 6     Follow Up Instructions: I discussed the assessment and treatment plan with the patient. The patient was provided an opportunity to ask questions and all were answered. The patient agreed with the plan and demonstrated an understanding of the instructions.  A copy of instructions were sent to the patient via MyChart unless otherwise noted below.    The patient was advised to call back or seek an in-person evaluation if the symptoms worsen or if the condition fails to improve as anticipated.  Time:  I spent 10 minutes with the patient via telehealth technology discussing the above problems/concerns.    Apolonio Schneiders, FNP

## 2022-11-21 ENCOUNTER — Other Ambulatory Visit: Payer: Self-pay

## 2022-11-21 MED ORDER — ROSUVASTATIN CALCIUM 10 MG PO TABS: 10.0000 mg | ORAL_TABLET | Freq: Every day | ORAL | 0 refills | Status: DC

## 2022-12-03 ENCOUNTER — Other Ambulatory Visit: Payer: Self-pay

## 2022-12-03 DIAGNOSIS — F411 Generalized anxiety disorder: Secondary | ICD-10-CM

## 2022-12-03 DIAGNOSIS — I10 Essential (primary) hypertension: Secondary | ICD-10-CM

## 2022-12-03 DIAGNOSIS — F320 Major depressive disorder, single episode, mild: Secondary | ICD-10-CM

## 2022-12-03 MED ORDER — CITALOPRAM HYDROBROMIDE 40 MG PO TABS: 40.0000 mg | ORAL_TABLET | Freq: Every day | ORAL | 1 refills | Status: DC

## 2022-12-03 MED ORDER — ROSUVASTATIN CALCIUM 10 MG PO TABS: 10.0000 mg | ORAL_TABLET | Freq: Every day | ORAL | 1 refills | Status: DC

## 2022-12-03 MED ORDER — AMLODIPINE BESYLATE 5 MG PO TABS: ORAL_TABLET | ORAL | 1 refills | Status: DC

## 2022-12-04 ENCOUNTER — Other Ambulatory Visit: Payer: Self-pay

## 2022-12-04 DIAGNOSIS — I10 Essential (primary) hypertension: Secondary | ICD-10-CM

## 2022-12-04 MED ORDER — ROSUVASTATIN CALCIUM 10 MG PO TABS: 10.0000 mg | ORAL_TABLET | Freq: Every day | ORAL | 1 refills | Status: DC

## 2022-12-04 MED ORDER — AMLODIPINE BESYLATE 5 MG PO TABS: ORAL_TABLET | ORAL | 1 refills | Status: DC

## 2023-04-05 ENCOUNTER — Other Ambulatory Visit: Payer: Self-pay | Admitting: Obstetrics

## 2023-04-05 DIAGNOSIS — Z1231 Encounter for screening mammogram for malignant neoplasm of breast: Secondary | ICD-10-CM

## 2023-04-23 ENCOUNTER — Ambulatory Visit
Admission: RE | Admit: 2023-04-23 | Discharge: 2023-04-23 | Disposition: A | Discharge status: Home / Self Care | Payer: 59 | Source: Ambulatory Visit | Attending: Obstetrics | Admitting: Obstetrics

## 2023-04-23 DIAGNOSIS — Z1231 Encounter for screening mammogram for malignant neoplasm of breast: Secondary | ICD-10-CM

## 2023-05-04 ENCOUNTER — Other Ambulatory Visit: Payer: Self-pay | Admitting: Family Medicine

## 2023-05-04 DIAGNOSIS — F411 Generalized anxiety disorder: Secondary | ICD-10-CM

## 2023-05-04 DIAGNOSIS — F320 Major depressive disorder, single episode, mild: Secondary | ICD-10-CM

## 2023-07-15 ENCOUNTER — Other Ambulatory Visit: Payer: Self-pay | Admitting: Nurse Practitioner

## 2023-07-15 DIAGNOSIS — J0110 Acute frontal sinusitis, unspecified: Secondary | ICD-10-CM

## 2023-08-24 ENCOUNTER — Other Ambulatory Visit: Payer: Self-pay | Admitting: Family Medicine

## 2023-08-24 DIAGNOSIS — F320 Major depressive disorder, single episode, mild: Secondary | ICD-10-CM

## 2023-08-24 DIAGNOSIS — F411 Generalized anxiety disorder: Secondary | ICD-10-CM

## 2023-11-04 DIAGNOSIS — Z01419 Encounter for gynecological examination (general) (routine) without abnormal findings: Secondary | ICD-10-CM | POA: Diagnosis not present

## 2023-11-04 DIAGNOSIS — Z1331 Encounter for screening for depression: Secondary | ICD-10-CM | POA: Diagnosis not present

## 2023-11-29 DIAGNOSIS — B9689 Other specified bacterial agents as the cause of diseases classified elsewhere: Secondary | ICD-10-CM | POA: Diagnosis not present

## 2023-11-29 DIAGNOSIS — J019 Acute sinusitis, unspecified: Secondary | ICD-10-CM | POA: Diagnosis not present

## 2024-02-01 ENCOUNTER — Encounter: Payer: Self-pay | Admitting: Emergency Medicine

## 2024-02-01 ENCOUNTER — Ambulatory Visit: Admission: EM | Admit: 2024-02-01 | Discharge: 2024-02-01 | Disposition: A | Discharge status: Home / Self Care

## 2024-02-01 DIAGNOSIS — L03113 Cellulitis of right upper limb: Secondary | ICD-10-CM | POA: Diagnosis not present

## 2024-02-01 DIAGNOSIS — W540XXA Bitten by dog, initial encounter: Secondary | ICD-10-CM | POA: Diagnosis not present

## 2024-02-01 DIAGNOSIS — S61551A Open bite of right wrist, initial encounter: Secondary | ICD-10-CM

## 2024-02-01 DIAGNOSIS — Z23 Encounter for immunization: Secondary | ICD-10-CM

## 2024-02-01 HISTORY — DX: Essential (primary) hypertension: I10

## 2024-02-01 MED ORDER — TETANUS-DIPHTH-ACELL PERTUSSIS 5-2.5-18.5 LF-MCG/0.5 IM SUSY
0.5000 mL | PREFILLED_SYRINGE | Freq: Once | INTRAMUSCULAR | Status: AC
  Administered 2024-02-01: 0.5 mL via INTRAMUSCULAR

## 2024-02-01 MED ORDER — AMOXICILLIN-POT CLAVULANATE 875-125 MG PO TABS: 1.0000 | ORAL_TABLET | Freq: Two times a day (BID) | ORAL | 0 refills | Status: AC

## 2024-02-01 NOTE — Discharge Instructions (Signed)
 Your Tdap was updated today.  It is good for the next 10 years. Take medication as prescribed. Apply cool compresses to the affected area to help with pain or swelling. Clean the area at least twice daily with Dial Gold bar soap or another type of antibacterial soap. Keep the right upper extremity elevated as much as possible to help with pain and swelling. Follow-up immediately if you experience increased redness, swelling, fever, chills, or other concerns. Follow-up as needed.

## 2024-02-01 NOTE — ED Triage Notes (Signed)
 Dog bite to right wrist last night.  States dog is up to date on vaccine.

## 2024-02-01 NOTE — ED Provider Notes (Signed)
 [facility]    CSN: [id number] Arrival date & time: 02/01/24  1031      History   Chief Complaint No chief complaint on file.   HPI Misty Hayes is a 53 y.o. female.   The history is provided by the patient.   Patient presents for a dog bite to her right wrist that occurred last evening.  Patient states that she was bit by her dog.  States that the dogs rabies vaccination is up-to-date.  She complains of swelling to the right wrist, and difficulty making a fist.  States that she has not had fever, chills, chest pain, abdominal pain, nausea, vomiting, diarrhea, or rash.  Bleeding is controlled at this time.  She is unsure of her last tetanus shot.  States that she cleansed the area and also used a lotion with lidocaine for pain.  Past Medical History:  Diagnosis Date   Hyperlipidemia    Hypertension    Insomnia    Mitral valve prolapse     Patient Active Problem List   Diagnosis Date Noted   Hyperlipidemia 11/07/2021   Eczema 11/07/2021   Elevated alkaline phosphatase level 05/18/2021   Depression, major, single episode, mild ([facility]) 11/16/2020   Essential hypertension 06/10/2018   Generalized anxiety disorder 04/10/2016   Insomnia 01/30/2013    Past Surgical History:  Procedure Laterality Date   AUGMENTATION MAMMAPLASTY Bilateral    BREAST ENHANCEMENT SURGERY     WISDOM TOOTH EXTRACTION      OB History   No obstetric history on file.      Home Medications    Prior to Admission medications   Medication Sig Start Date End Date Taking? Authorizing Provider  amoxicillin-clavulanate (AUGMENTIN) 875-125 MG tablet Take 1 tablet by mouth every 12 (twelve) hours. 02/01/24  Yes Leath-Warren, Sadie Haber, NP  buPROPion (WELLBUTRIN XL) 150 MG 24 hr tablet Take 150 mg by mouth daily.   Yes [provider]  lisinopril (ZESTRIL) 2.5 MG tablet Take 2.5 mg by mouth daily.   Yes [provider]  citalopram (CELEXA) 40 MG tablet TAKE 1 TABLET  DAILY 08/27/23   Cook, Jayce G, DO  fluticasone (FLONASE) 50 MCG/ACT nasal spray Place 2 sprays into both nostrils daily. 05/07/22   Tommie Sams, DO  fluticasone (FLONASE) 50 MCG/ACT nasal spray Place 2 sprays into both nostrils daily. 11/19/22   Viviano Simas, FNP  Multiple Vitamin (MULTIVITAMIN) tablet Take 1 tablet by mouth daily.    [provider]  OVER THE COUNTER MEDICATION Calcium take 2 every day    [provider]  rosuvastatin (CRESTOR) 10 MG tablet TAKE 1 TABLET DAILY 05/06/23   Tommie Sams, DO    Family History Family History  Problem Relation Age of Onset   Breast cancer Mother 86    Social History Social History   Tobacco Use   Smoking status: Never   Smokeless tobacco: Never     Allergies   Patient has no known allergies.   Review of Systems Review of Systems Per HPI  Physical Exam Triage Vital Signs ED Triage Vitals  Encounter Vitals Group     BP 02/01/24 1100 101/63     Systolic BP Percentile --      Diastolic BP Percentile --      Pulse Rate 02/01/24 1100 96     Resp 02/01/24 1100 18     Temp 02/01/24 1100 98.1 F (36.7 C)     Temp Source 02/01/24 1100 Oral  SpO2 02/01/24 1100 93 %     Weight --      Height --      Head Circumference --      Peak Flow --      Pain Score 02/01/24 1101 1     Pain Loc --      Pain Education --      Exclude from Growth Chart --    No data found.  Updated Vital Signs BP 101/63 (BP Location: Right Arm)   Pulse 96   Temp 98.1 F (36.7 C) (Oral)   Resp 18   SpO2 93%   Visual Acuity Right Eye Distance:   Left Eye Distance:   Bilateral Distance:    Right Eye Near:   Left Eye Near:    Bilateral Near:     Physical Exam Vitals and nursing note reviewed.  Constitutional:      General: She is not in acute distress.    Appearance: Normal appearance.  HENT:     Head: Normocephalic.  Eyes:     Extraocular Movements: Extraocular movements intact.     Pupils: Pupils are equal, round,  and reactive to light.  Pulmonary:     Effort: Pulmonary effort is normal.  Musculoskeletal:     Right wrist: Swelling present.     Cervical back: Normal range of motion.     Comments: Puncture wound to radial aspect of right wrist.  Skin:    General: Skin is warm and dry.     Comments: Approximate 0.75 cm puncture wound/laceration noted to the radial aspect of the right wrist.  Mild swelling present surrounding the wrist.  There is no obvious erythema or warmth, foul-smelling drainage, or oozing.  Neurological:     General: No focal deficit present.     Mental Status: She is alert and oriented to person, place, and time.  Psychiatric:        Mood and Affect: Mood normal.        Behavior: Behavior normal.      UC Treatments / Results  Labs (all labs ordered are listed, but only abnormal results are displayed) Labs Reviewed - No data to display  EKG   Radiology No results found.  Procedures Procedures (including critical care time)  Medications Ordered in UC Medications  Tdap (BOOSTRIX) injection 0.5 mL (0.5 mLs Intramuscular Given 02/01/24 1141)    Initial Impression / Assessment and Plan / UC Course  I have reviewed the triage vital signs and the nursing notes.  Pertinent labs & imaging results that were available during my care of the patient were reviewed by me and considered in my medical decision making (see chart for details).  Patient with dog bite to the right wrist.  Tdap updated.  Area was cleansed and dressed.  Will start patient on Augmentin 875/125 mg twice daily for the next 7 days for infection prophylaxis.  Supportive care recommendations were provided and discussed with the patient to include over-the-counter analgesics, RICE, and elevation.  Patient was given strict follow-up precautions.  Patient was in agreement with this plan of care and verbalized understanding.  All questions were answered.  Patient stable for discharge.  Final Clinical  Impressions(s) / UC Diagnoses   Final diagnoses:  Cellulitis of right wrist  Dog bite of right wrist, initial encounter  Need for Tdap vaccination     Discharge Instructions      Your Tdap was updated today.  It is good for the next 10 years.  Take medication as prescribed. Apply cool compresses to the affected area to help with pain or swelling. Clean the area at least twice daily with Dial Gold bar soap or another type of antibacterial soap. Keep the right upper extremity elevated as much as possible to help with pain and swelling. Follow-up immediately if you experience increased redness, swelling, fever, chills, or other concerns. Follow-up as needed.     ED Prescriptions     Medication Sig Dispense Auth. Provider   amoxicillin-clavulanate (AUGMENTIN) 875-125 MG tablet Take 1 tablet by mouth every 12 (twelve) hours. 14 tablet Leath-Warren, Sadie Haber, NP      PDMP not reviewed this encounter.   Abran Cantor, NP 02/01/24 1145

## 2024-02-10 DIAGNOSIS — M79671 Pain in right foot: Secondary | ICD-10-CM | POA: Diagnosis not present

## 2024-02-10 DIAGNOSIS — Z1331 Encounter for screening for depression: Secondary | ICD-10-CM | POA: Diagnosis not present

## 2024-02-10 DIAGNOSIS — Z0000 Encounter for general adult medical examination without abnormal findings: Secondary | ICD-10-CM | POA: Diagnosis not present

## 2024-02-10 DIAGNOSIS — E782 Mixed hyperlipidemia: Secondary | ICD-10-CM | POA: Diagnosis not present

## 2024-02-10 DIAGNOSIS — E559 Vitamin D deficiency, unspecified: Secondary | ICD-10-CM | POA: Diagnosis not present

## 2024-03-23 DIAGNOSIS — M7671 Peroneal tendinitis, right leg: Secondary | ICD-10-CM | POA: Diagnosis not present

## 2024-04-09 ENCOUNTER — Other Ambulatory Visit: Payer: Self-pay | Admitting: Obstetrics

## 2024-04-09 DIAGNOSIS — Z1231 Encounter for screening mammogram for malignant neoplasm of breast: Secondary | ICD-10-CM

## 2024-04-20 DIAGNOSIS — M7671 Peroneal tendinitis, right leg: Secondary | ICD-10-CM | POA: Diagnosis not present

## 2024-04-24 ENCOUNTER — Ambulatory Visit
Admission: RE | Admit: 2024-04-24 | Discharge: 2024-04-24 | Disposition: A | Discharge status: Home / Self Care | Source: Ambulatory Visit | Attending: Obstetrics | Admitting: Obstetrics

## 2024-04-24 DIAGNOSIS — Z1231 Encounter for screening mammogram for malignant neoplasm of breast: Secondary | ICD-10-CM

## 2024-04-27 DIAGNOSIS — Z1211 Encounter for screening for malignant neoplasm of colon: Secondary | ICD-10-CM | POA: Diagnosis not present

## 2024-08-11 DIAGNOSIS — E66811 Obesity, class 1: Secondary | ICD-10-CM | POA: Diagnosis not present

## 2024-08-11 DIAGNOSIS — I10 Essential (primary) hypertension: Secondary | ICD-10-CM | POA: Diagnosis not present

## 2024-08-11 DIAGNOSIS — E782 Mixed hyperlipidemia: Secondary | ICD-10-CM | POA: Diagnosis not present

## 2024-08-11 DIAGNOSIS — N289 Disorder of kidney and ureter, unspecified: Secondary | ICD-10-CM | POA: Diagnosis not present
